# Patient Record
Sex: Female | Born: 1935 | Hispanic: No | Smoking: Former smoker
Health system: Southern US, Community
[De-identification: ages and names within clinical notes are randomized; demographics above are authoritative.]

## PROBLEM LIST (undated history)

## (undated) DIAGNOSIS — E785 Hyperlipidemia, unspecified: Secondary | ICD-10-CM

## (undated) DIAGNOSIS — M1990 Unspecified osteoarthritis, unspecified site: Secondary | ICD-10-CM

## (undated) DIAGNOSIS — F0280 Dementia in other diseases classified elsewhere without behavioral disturbance: Secondary | ICD-10-CM

## (undated) DIAGNOSIS — G309 Alzheimer's disease, unspecified: Secondary | ICD-10-CM

## (undated) DIAGNOSIS — K449 Diaphragmatic hernia without obstruction or gangrene: Secondary | ICD-10-CM

## (undated) DIAGNOSIS — H3530 Unspecified macular degeneration: Secondary | ICD-10-CM

## (undated) HISTORY — PX: ABDOMINAL HYSTERECTOMY: SHX81

---

## 2010-05-16 ENCOUNTER — Ambulatory Visit: Payer: Self-pay | Admitting: Family Medicine

## 2010-05-16 DIAGNOSIS — F39 Unspecified mood [affective] disorder: Secondary | ICD-10-CM | POA: Insufficient documentation

## 2010-05-16 DIAGNOSIS — E785 Hyperlipidemia, unspecified: Secondary | ICD-10-CM

## 2010-05-16 DIAGNOSIS — F0280 Dementia in other diseases classified elsewhere without behavioral disturbance: Secondary | ICD-10-CM

## 2010-05-16 DIAGNOSIS — G309 Alzheimer's disease, unspecified: Secondary | ICD-10-CM

## 2010-05-23 ENCOUNTER — Encounter: Payer: Self-pay | Admitting: Family Medicine

## 2010-05-24 LAB — CONVERTED CEMR LAB
ALT: 13 units/L (ref 0–35)
AST: 24 units/L (ref 0–37)
Albumin: 4.1 g/dL (ref 3.5–5.2)
Alkaline Phosphatase: 63 units/L (ref 39–117)
LDL Cholesterol: 74 mg/dL (ref 0–99)
Potassium: 4.7 meq/L (ref 3.5–5.3)
Sodium: 139 meq/L (ref 135–145)
TSH: 2.821 microintl units/mL (ref 0.350–4.500)
Total Bilirubin: 0.5 mg/dL (ref 0.3–1.2)
Total Protein: 6.7 g/dL (ref 6.0–8.3)
VLDL: 25 mg/dL (ref 0–40)

## 2010-08-06 NOTE — Assessment & Plan Note (Signed)
Summary: NOV: Alzheimers   Vital Signs:  Patient profile:   77 year old female Height:      67 inches Weight:      201 pounds BMI:     31.59 Pulse rate:   85 / minute BP sitting:   122 / 73  (right arm) Cuff size:   regular  Vitals Entered By: Avon Gully CMA, Duncan Dull) (May 16, 2010 1:38 PM) CC: Np-est care   CC:  Np-est care.  History of Present Illness: Previsouly seein Dr. Saunders Revel in [place].  Moved her about 2 months ago. Sleeps well. No s.e. of her medication.  Goest to the community center twice a week. Hx of Alzhimers.    Her son is worried she is not taking her meds correctly because she is sometimes forgetting them and sometimes has taken too many at one time. She is in a retirement community. He is looking at getting a monitored med system for her.    Habits & Providers  Alcohol-Tobacco-Diet     Alcohol drinks/day: <1     Tobacco Status: quit     Year Quit: 10 yr ago  Exercise-Depression-Behavior     Drug Use: never  Current Medications (verified): 1)  Tricor 145 Mg Tabs (Fenofibrate) .... Take One By Mouth Once Daily 2)  Lipitor 20 Mg Tabs (Atorvastatin Calcium) .... Take One Tablet By Mouth At Bedtime 3)  Donepezil Hcl 10 Mg Tabs (Donepezil Hcl) .... Take One Tablet By Mouth Once Daily 4)  Trazodone Hcl 150 Mg Tabs (Trazodone Hcl) .... Take One Tablet By Mouth At Bedtime  Allergies (verified): No Known Drug Allergies  Comments:  Nurse/Medical Assistant: The patient's medications and allergies were reviewed with the patient and were updated in the Medication and Allergy Lists. Avon Gully CMA, Duncan Dull) (May 16, 2010 1:43 PM)  Past History:  Past Surgical History: none   Family History: son with hypercholesterolemia and hypertension. 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  Completed high school.  Widowed.  Has one son.  Currently living in a retirement community.  Quit smoking 10 years ago.  No  alcohol.  No drug use.  Two caffeinated drinks daily.  No regular exercise.Smoking Status:  quit Drug Use:  never  Review of Systems       No fever/sweats/weakness, unexplained weight loss/gain.  No vison changes.  No difficulty hearing/ringing in ears, hay fever/allergies.  No chest pain/discomfort, palpitations.  No Br lump/nipple discharge.  No cough/wheeze.  No blood in BM, nausea/vomiting/diarrhea.  No nighttime urination, leaking urine, unusual vaginal bleeding, discharge (penis or vagina).  No muscle/joint pain. No rash, change in mole.  No HA, memory loss.  No anxiety, sleep d/o, depression.  No easy bruising/bleeding, unexplained lump   Physical Exam  General:  Well-developed,well-nourished,in no acute distress; alert,appropriate and cooperative throughout examination. Overweight female who appears younger than stated age.  Lungs:  Normal respiratory effort, chest expands symmetrically. Lungs are clear to auscultation, no crackles or wheezes. Heart:  Normal rate and regular rhythm. S1 and S2 normal without gallop, murmur, click, rub or other extra sounds. No carotid bruits.  Skin:  no rashes.   Cervical Nodes:  No lymphadenopathy noted Psych:  Cognition and judgment appear intact. Alert and cooperative with normal attention span and concentration. No apparent delusions, illusions, hallucinations   Impression & Recommendations:  Problem # 1:  ALZHEIMER'S DISEASE (ICD-331.0) I really need to get her records to have a better idea of the  severity of her disease I recommended reading the 36 hour day to her step-son who is here with her today. Will check TSH as well.  F/U in one month and can discussed if might benefit from adding namenda to her regimen.  Orders: T-TSH ([id number])  Problem # 2:  HYPERLIPIDEMIA (ICD-272.4) Due for labs to see if she is at goal. she is tolerating her medications well.  There there is definitely a question about the consistency of her taking the  medications and she may even been taking more than one pill at time of the same medication. Her updated medication list for this problem includes:    Tricor 145 Mg Tabs (Fenofibrate) .Marland Kitchen... Take one by mouth once daily    Lipitor 20 Mg Tabs (Atorvastatin calcium) .Marland Kitchen... Take one tablet by mouth at bedtime  Orders: T-Comprehensive Metabolic Panel [id number]) T-Lipid Profile ([id number])  Complete Medication List: 1)  Tricor 145 Mg Tabs (Fenofibrate) .... Take one by mouth once daily 2)  Lipitor 20 Mg Tabs (Atorvastatin calcium) .... Take one tablet by mouth at bedtime 3)  Donepezil Hcl 10 Mg Tabs (Donepezil hcl) .... Take one tablet by mouth once daily 4)  Trazodone Hcl 150 Mg Tabs (Trazodone hcl) .... Take one tablet by mouth at bedtime  Other Orders: Flu Vaccine 90yrs + MEDICARE PATIENTS (M8413) Administration Flu vaccine - MCR (K4401)  Patient Instructions: 1)  Follow up in 1-2 months once we have your records so we can follow up on your memory.  2)  You were given a flu vaccine today. Prescriptions: TRAZODONE HCL 150 MG TABS (TRAZODONE HCL) take one tablet by mouth at bedtime  #30 x 4   Entered and Authorized by:   Nani Gasser MD   Signed by:   Nani Gasser MD on 05/16/2010   Method used:   Electronically to        [facility] [phone]* (retail)       [address]       [place], [place]  [address]       Ph: [phone]       Fax: [phone]   RxID:   [id number]    Orders Added: 1)  T-Comprehensive Metabolic Panel [[id number]] 2)  T-Lipid Profile [[id number]] 3)  T-TSH [[id number]] 4)  New Patient Level III [37628] 5)  Flu Vaccine 56yrs + MEDICARE PATIENTS [Q2039] 6)  Administration Flu vaccine - MCR [G0008] Flu Vaccine Consent Questions     Do you have a history of severe allergic reactions to this vaccine? no    Any prior history of allergic reactions to egg and/or gelatin? no    Do you have a sensitivity to the preservative Thimersol? no    Do  you have a past history of Guillan-Barre Syndrome? no    Do you currently have an acute febrile illness? no    Have you ever had a severe reaction to latex? no    Vaccine information given and explained to patient? yes    Are you currently pregnant? no    Lot Number:[id number]   Exp Date:01/04/2011   Site Given  Left Deltoid IM 3)  T-TSH [[id number]] 4)  New Patient Level III [99203] 5)  Flu Vaccine 50yrs + MEDICARE PATIENTS [Q2039] 6)  Administration Flu vaccine - MCR [G0008]   .lbmedflu

## 2010-10-14 ENCOUNTER — Other Ambulatory Visit: Payer: Self-pay | Admitting: Family Medicine

## 2010-10-15 ENCOUNTER — Other Ambulatory Visit: Payer: Self-pay | Admitting: *Deleted

## 2010-10-15 MED ORDER — FENOFIBRATE 145 MG PO TABS: 145.0000 mg | ORAL_TABLET | Freq: Every day | ORAL | Status: DC

## 2010-10-15 MED ORDER — DONEPEZIL HCL 10 MG PO TABS: 10.0000 mg | ORAL_TABLET | Freq: Every day | ORAL | Status: DC

## 2010-10-15 MED ORDER — TRAZODONE HCL 150 MG PO TABS: 150.0000 mg | ORAL_TABLET | Freq: Every day | ORAL | Status: DC

## 2010-10-15 MED ORDER — ATORVASTATIN CALCIUM 20 MG PO TABS: 20.0000 mg | ORAL_TABLET | Freq: Every day | ORAL | Status: DC

## 2010-12-06 ENCOUNTER — Other Ambulatory Visit: Payer: Self-pay | Admitting: Family Medicine

## 2011-01-06 ENCOUNTER — Other Ambulatory Visit: Payer: Self-pay | Admitting: Family Medicine

## 2011-01-11 ENCOUNTER — Other Ambulatory Visit: Payer: Self-pay | Admitting: Family Medicine

## 2011-01-12 NOTE — Telephone Encounter (Signed)
PT NEEDS OFFICE VISIT WITHIN 30 DAYS FOR ALZHEIMER AND FASTING LABS(CHOLESTEROL).  LAST SEEN 11-11 AND PT WAS SUPPOSE TO FOLLOW UP IN 1 MTH AND DIDN'T.

## 2011-02-13 ENCOUNTER — Telehealth: Payer: Self-pay | Admitting: Family Medicine

## 2011-02-13 ENCOUNTER — Other Ambulatory Visit: Payer: Self-pay | Admitting: *Deleted

## 2011-02-13 MED ORDER — DONEPEZIL HCL 10 MG PO TABS: 10.0000 mg | ORAL_TABLET | Freq: Every day | ORAL | Status: DC

## 2011-02-13 MED ORDER — ATORVASTATIN CALCIUM 20 MG PO TABS: 20.0000 mg | ORAL_TABLET | Freq: Every day | ORAL | Status: DC

## 2011-02-13 MED ORDER — TRAZODONE HCL 150 MG PO TABS: 150.0000 mg | ORAL_TABLET | Freq: Every day | ORAL | Status: DC

## 2011-02-13 MED ORDER — FENOFIBRATE 145 MG PO TABS: 145.0000 mg | ORAL_TABLET | Freq: Every day | ORAL | Status: DC

## 2011-02-13 NOTE — Telephone Encounter (Signed)
Prarmacy  ([facility]) calling because pt is switching all her medications to this pharmacy and they are going to need new scripts.  Plan:  Reviewed the chart and the scripts were sent to [facility].  Called pharm [facility] and told them to retrieve from the [facility] in [place].  Pharm able to do. Jarvis Newcomer, LPN Domingo Dimes

## 2011-03-04 ENCOUNTER — Other Ambulatory Visit: Payer: Self-pay | Admitting: *Deleted

## 2011-03-04 MED ORDER — ATORVASTATIN CALCIUM 20 MG PO TABS: 20.0000 mg | ORAL_TABLET | Freq: Every day | ORAL | Status: DC

## 2011-03-04 MED ORDER — FENOFIBRATE 145 MG PO TABS: 145.0000 mg | ORAL_TABLET | Freq: Every day | ORAL | Status: DC

## 2011-03-04 MED ORDER — DONEPEZIL HCL 10 MG PO TABS: 10.0000 mg | ORAL_TABLET | Freq: Every day | ORAL | Status: DC

## 2011-03-06 ENCOUNTER — Other Ambulatory Visit: Payer: Self-pay | Admitting: *Deleted

## 2011-03-11 ENCOUNTER — Other Ambulatory Visit: Payer: Self-pay | Admitting: *Deleted

## 2011-03-11 MED ORDER — DONEPEZIL HCL 10 MG PO TABS: 10.0000 mg | ORAL_TABLET | Freq: Every day | ORAL | Status: DC

## 2011-03-26 ENCOUNTER — Other Ambulatory Visit: Payer: Self-pay | Admitting: *Deleted

## 2011-03-26 MED ORDER — TRAZODONE HCL 150 MG PO TABS: 150.0000 mg | ORAL_TABLET | Freq: Every day | ORAL | Status: DC

## 2011-04-06 ENCOUNTER — Other Ambulatory Visit: Payer: Self-pay | Admitting: Family Medicine

## 2011-04-23 ENCOUNTER — Telehealth: Payer: Self-pay | Admitting: *Deleted

## 2011-04-23 NOTE — Telephone Encounter (Signed)
Patient needs a letter stating the medications she is on are medically necessary so the insurance will pay for per their new policy

## 2011-04-24 ENCOUNTER — Telehealth: Payer: Self-pay | Admitting: Family Medicine

## 2011-04-24 NOTE — Telephone Encounter (Signed)
Pt's son called and said he was suppose to have picked up a letter for his mother, and he had been called to let him know it was ready, but he had not been able to get here before our office closed.  Plan:  Pt's son  informed will retrieve the letter from up front and fax it to the number he requested. Jarvis Newcomer, LPN Domingo Dimes

## 2011-05-08 ENCOUNTER — Encounter: Payer: Self-pay | Admitting: Family Medicine

## 2011-05-14 ENCOUNTER — Encounter: Payer: Self-pay | Admitting: Family Medicine

## 2011-05-14 ENCOUNTER — Ambulatory Visit (INDEPENDENT_AMBULATORY_CARE_PROVIDER_SITE_OTHER): Payer: Medicare Other | Admitting: Family Medicine

## 2011-05-14 VITALS — BP 104/69 | HR 91 | Wt 192.0 lb

## 2011-05-14 DIAGNOSIS — G309 Alzheimer's disease, unspecified: Secondary | ICD-10-CM

## 2011-05-14 DIAGNOSIS — Z23 Encounter for immunization: Secondary | ICD-10-CM

## 2011-05-14 DIAGNOSIS — E785 Hyperlipidemia, unspecified: Secondary | ICD-10-CM

## 2011-05-14 DIAGNOSIS — F0280 Dementia in other diseases classified elsewhere without behavioral disturbance: Secondary | ICD-10-CM

## 2011-05-14 NOTE — Patient Instructions (Addendum)
We will call you with your lab results. If you don't here from us in about a week then please give us a call at [phone].  

## 2011-05-14 NOTE — Progress Notes (Signed)
  Subjective:    Patient ID: Sandra Tucker, female    DOB: 10/07/1922, 77 y.o.   MRN: [id number]  Hyperlipidemia This is a chronic problem. The current episode started more than 1 month ago. The problem is controlled. Recent lipid tests were reviewed and are normal. She has no history of obesity. Pertinent negatives include no chest pain, myalgias or shortness of breath. Current antihyperlipidemic treatment includes statins. The current treatment provides moderate improvement of lipids. There are no compliance problems.  Risk factors for coronary artery disease include obesity.   Getting help with hre meds daily. She lives in an assisted living center but her son helps pay for someone to come in and assist her with her medications including her eyedrops.  Sees Dr. Jackquline Bosch is seeing her for her eyes.  On travastan drops. She has noticed improvement in her vision since starting the drops.  Dementia - dong well on the Aricpet. No SE. her son feels that her dementia is stable. She is still able to get out and participate in activities at the assisted living center. She feels motivated to do that and denies any feelings of depression or social withdrawal.   Review of Systems  Respiratory: Negative for shortness of breath.   Cardiovascular: Negative for chest pain.  Musculoskeletal: Negative for myalgias.       Objective:   Physical Exam  Constitutional: She is oriented to person, place, and time. She appears well-developed and well-nourished.  HENT:  Head: Normocephalic and atraumatic.  Neck: Neck supple. No thyromegaly present.  Cardiovascular: Normal rate, regular rhythm and normal heart sounds.   Pulmonary/Chest: Effort normal and breath sounds normal.  Musculoskeletal: She exhibits no edema.  Lymphadenopathy:    She has no cervical adenopathy.  Neurological: She is alert and oriented to person, place, and time.  Skin: Skin is warm and dry.  Psychiatric: She has a normal mood and  affect. Her behavior is normal.          Assessment & Plan:  Dementia - MMSE 05/2011  14/30.  (passing 26). Will refill her aricpet. Followup in 1 year. We can recheck her in a mental status exam at that time. If she has a positive Aricept please call the office. I encouraged her to continue to be as active as possible participate in activities to help stimulate her brain. She is getting assistance with her medications.  Hyperlipidemia- Due for labs to recheck. BP looks great. Given lab slip today. We will call her with the results.  She was given the flu shot for the shingles vaccine today. Her son is not sure when her last tetanus shot was. We will try to see if we can find this in her old records.

## 2011-05-14 NOTE — Progress Notes (Signed)
Addended by: Wyline Beady on: 05/14/2011 01:23 PM   Modules accepted: Orders

## 2011-05-16 ENCOUNTER — Other Ambulatory Visit: Payer: Self-pay | Admitting: Family Medicine

## 2011-05-21 LAB — COMPLETE METABOLIC PANEL WITH GFR
AST: 32 U/L (ref 0–37)
Alkaline Phosphatase: 60 U/L (ref 39–117)
GFR, Est Non African American: 71 mL/min — ABNORMAL LOW (ref >89)
Glucose, Bld: 88 mg/dL (ref 70–99)
Potassium: 4.8 mEq/L (ref 3.5–5.3)
Sodium: 141 mEq/L (ref 135–145)
Total Bilirubin: 0.6 mg/dL (ref 0.3–1.2)
Total Protein: 6.5 g/dL (ref 6.0–8.3)

## 2011-05-21 LAB — LIPID PANEL
HDL: 32 mg/dL — ABNORMAL LOW (ref >39)
LDL Cholesterol: 93 mg/dL (ref 0–99)
Total CHOL/HDL Ratio: 5.1 Ratio
VLDL: 39 mg/dL (ref 0–40)

## 2011-05-21 LAB — CBC
HCT: 39.7 % (ref 36.0–46.0)
Hemoglobin: 12.2 g/dL (ref 12.0–15.0)
MCHC: 30.7 g/dL (ref 30.0–36.0)
MCV: 76.9 fL — ABNORMAL LOW (ref 78.0–100.0)
WBC: 4 10*3/uL (ref 4.0–10.5)

## 2011-07-23 DIAGNOSIS — H353290 Exudative age-related macular degeneration, unspecified eye, stage unspecified: Secondary | ICD-10-CM | POA: Diagnosis not present

## 2011-07-23 DIAGNOSIS — H43819 Vitreous degeneration, unspecified eye: Secondary | ICD-10-CM | POA: Diagnosis not present

## 2011-07-23 DIAGNOSIS — H35059 Retinal neovascularization, unspecified, unspecified eye: Secondary | ICD-10-CM | POA: Diagnosis not present

## 2011-07-23 DIAGNOSIS — H31019 Macula scars of posterior pole (postinflammatory) (post-traumatic), unspecified eye: Secondary | ICD-10-CM | POA: Diagnosis not present

## 2011-08-17 ENCOUNTER — Other Ambulatory Visit: Payer: Self-pay | Admitting: Family Medicine

## 2011-08-18 ENCOUNTER — Telehealth: Payer: Self-pay | Admitting: *Deleted

## 2011-08-18 NOTE — Telephone Encounter (Signed)
Pt's son would like to speak with you in regards to medications. He states over the next couple of days will be fine if you would please call him. He states anytime will be fine for you to call.

## 2011-08-18 NOTE — Telephone Encounter (Signed)
Can you get some more specifics about the meds he has concerns or questions about first.

## 2011-08-19 MED ORDER — PRAVASTATIN SODIUM 40 MG PO TABS: 40.0000 mg | ORAL_TABLET | Freq: Every day | ORAL | Status: DC

## 2011-08-19 NOTE — Telephone Encounter (Signed)
Son states that funds are getting tight and is happy with where she is living. States that she has lost sight in her rt eye and is losing sight in the Lt eye. States that she has to have shots in her eye monthly. He would like to know is it necessary to have her on 2 chol meds? How much affect does it have on her health? States that if you call him today and he doesn't answer to leave him a message b/c he is looking into getting some work and he will call you back.

## 2011-08-19 NOTE — Telephone Encounter (Signed)
Already updated med list.

## 2011-08-19 NOTE — Telephone Encounter (Signed)
Son states he understands the complications that can result from changing her meds or if she doesn't take them at all. Son would like to try the Pravastatin. States he has already picked up the 2 chol meds for this month but wants to start her on the new one next month. What directions on the pravastatin so we can add it to med list and remove the other 2 chol meds?

## 2011-08-19 NOTE — Telephone Encounter (Signed)
OK, We can hold the tribenzor and we can change to pravastatin.  It is a little chearper than the atorvastatin though both come generic.

## 2011-08-20 DIAGNOSIS — H31019 Macula scars of posterior pole (postinflammatory) (post-traumatic), unspecified eye: Secondary | ICD-10-CM | POA: Diagnosis not present

## 2011-08-20 DIAGNOSIS — H353290 Exudative age-related macular degeneration, unspecified eye, stage unspecified: Secondary | ICD-10-CM | POA: Diagnosis not present

## 2011-08-20 DIAGNOSIS — H43819 Vitreous degeneration, unspecified eye: Secondary | ICD-10-CM | POA: Diagnosis not present

## 2011-08-20 DIAGNOSIS — H35059 Retinal neovascularization, unspecified, unspecified eye: Secondary | ICD-10-CM | POA: Diagnosis not present

## 2011-09-17 DIAGNOSIS — H31019 Macula scars of posterior pole (postinflammatory) (post-traumatic), unspecified eye: Secondary | ICD-10-CM | POA: Diagnosis not present

## 2011-09-17 DIAGNOSIS — H35059 Retinal neovascularization, unspecified, unspecified eye: Secondary | ICD-10-CM | POA: Diagnosis not present

## 2011-09-17 DIAGNOSIS — H353290 Exudative age-related macular degeneration, unspecified eye, stage unspecified: Secondary | ICD-10-CM | POA: Diagnosis not present

## 2011-09-17 DIAGNOSIS — H43819 Vitreous degeneration, unspecified eye: Secondary | ICD-10-CM | POA: Diagnosis not present

## 2011-09-24 ENCOUNTER — Other Ambulatory Visit: Payer: Self-pay | Admitting: Family Medicine

## 2011-09-24 MED ORDER — PRAVASTATIN SODIUM 40 MG PO TABS: 40.0000 mg | ORAL_TABLET | Freq: Every day | ORAL | Status: DC

## 2011-09-24 MED ORDER — DONEPEZIL HCL 10 MG PO TABS: 10.0000 mg | ORAL_TABLET | Freq: Every day | ORAL | Status: DC

## 2011-09-26 ENCOUNTER — Other Ambulatory Visit: Payer: Self-pay | Admitting: *Deleted

## 2011-09-26 ENCOUNTER — Other Ambulatory Visit: Payer: Self-pay | Admitting: Family Medicine

## 2011-09-26 MED ORDER — FENOFIBRATE 145 MG PO TABS: 145.0000 mg | ORAL_TABLET | Freq: Every day | ORAL | Status: DC

## 2011-10-20 DIAGNOSIS — H31019 Macula scars of posterior pole (postinflammatory) (post-traumatic), unspecified eye: Secondary | ICD-10-CM | POA: Diagnosis not present

## 2011-10-20 DIAGNOSIS — H35059 Retinal neovascularization, unspecified, unspecified eye: Secondary | ICD-10-CM | POA: Diagnosis not present

## 2011-10-20 DIAGNOSIS — H43819 Vitreous degeneration, unspecified eye: Secondary | ICD-10-CM | POA: Diagnosis not present

## 2011-10-20 DIAGNOSIS — H353290 Exudative age-related macular degeneration, unspecified eye, stage unspecified: Secondary | ICD-10-CM | POA: Diagnosis not present

## 2011-11-17 DIAGNOSIS — H353290 Exudative age-related macular degeneration, unspecified eye, stage unspecified: Secondary | ICD-10-CM | POA: Diagnosis not present

## 2011-11-17 DIAGNOSIS — H43819 Vitreous degeneration, unspecified eye: Secondary | ICD-10-CM | POA: Diagnosis not present

## 2011-11-17 DIAGNOSIS — H31019 Macula scars of posterior pole (postinflammatory) (post-traumatic), unspecified eye: Secondary | ICD-10-CM | POA: Diagnosis not present

## 2011-11-17 DIAGNOSIS — H35059 Retinal neovascularization, unspecified, unspecified eye: Secondary | ICD-10-CM | POA: Diagnosis not present

## 2011-11-25 ENCOUNTER — Other Ambulatory Visit: Payer: Self-pay | Admitting: Family Medicine

## 2011-12-16 DIAGNOSIS — H35059 Retinal neovascularization, unspecified, unspecified eye: Secondary | ICD-10-CM | POA: Diagnosis not present

## 2011-12-16 DIAGNOSIS — H31019 Macula scars of posterior pole (postinflammatory) (post-traumatic), unspecified eye: Secondary | ICD-10-CM | POA: Diagnosis not present

## 2011-12-16 DIAGNOSIS — H43819 Vitreous degeneration, unspecified eye: Secondary | ICD-10-CM | POA: Diagnosis not present

## 2011-12-16 DIAGNOSIS — H353290 Exudative age-related macular degeneration, unspecified eye, stage unspecified: Secondary | ICD-10-CM | POA: Diagnosis not present

## 2011-12-29 ENCOUNTER — Other Ambulatory Visit: Payer: Self-pay | Admitting: *Deleted

## 2011-12-29 ENCOUNTER — Other Ambulatory Visit: Payer: Self-pay | Admitting: Family Medicine

## 2011-12-29 MED ORDER — FENOFIBRATE 145 MG PO TABS: 145.0000 mg | ORAL_TABLET | Freq: Every day | ORAL | Status: DC

## 2012-02-05 DIAGNOSIS — H35059 Retinal neovascularization, unspecified, unspecified eye: Secondary | ICD-10-CM | POA: Diagnosis not present

## 2012-02-05 DIAGNOSIS — H31019 Macula scars of posterior pole (postinflammatory) (post-traumatic), unspecified eye: Secondary | ICD-10-CM | POA: Diagnosis not present

## 2012-02-05 DIAGNOSIS — H353290 Exudative age-related macular degeneration, unspecified eye, stage unspecified: Secondary | ICD-10-CM | POA: Diagnosis not present

## 2012-02-05 DIAGNOSIS — H43819 Vitreous degeneration, unspecified eye: Secondary | ICD-10-CM | POA: Diagnosis not present

## 2012-02-06 ENCOUNTER — Other Ambulatory Visit: Payer: Self-pay | Admitting: Family Medicine

## 2012-03-08 ENCOUNTER — Other Ambulatory Visit: Payer: Self-pay | Admitting: Family Medicine

## 2012-03-23 DIAGNOSIS — H43819 Vitreous degeneration, unspecified eye: Secondary | ICD-10-CM | POA: Diagnosis not present

## 2012-03-23 DIAGNOSIS — H35059 Retinal neovascularization, unspecified, unspecified eye: Secondary | ICD-10-CM | POA: Diagnosis not present

## 2012-03-23 DIAGNOSIS — H353290 Exudative age-related macular degeneration, unspecified eye, stage unspecified: Secondary | ICD-10-CM | POA: Diagnosis not present

## 2012-03-23 DIAGNOSIS — H31019 Macula scars of posterior pole (postinflammatory) (post-traumatic), unspecified eye: Secondary | ICD-10-CM | POA: Diagnosis not present

## 2012-04-20 DIAGNOSIS — H353290 Exudative age-related macular degeneration, unspecified eye, stage unspecified: Secondary | ICD-10-CM | POA: Diagnosis not present

## 2012-04-20 DIAGNOSIS — H35059 Retinal neovascularization, unspecified, unspecified eye: Secondary | ICD-10-CM | POA: Diagnosis not present

## 2012-05-14 ENCOUNTER — Other Ambulatory Visit: Payer: Self-pay | Admitting: Family Medicine

## 2012-05-18 DIAGNOSIS — H353290 Exudative age-related macular degeneration, unspecified eye, stage unspecified: Secondary | ICD-10-CM | POA: Diagnosis not present

## 2012-05-18 DIAGNOSIS — H43819 Vitreous degeneration, unspecified eye: Secondary | ICD-10-CM | POA: Diagnosis not present

## 2012-05-18 DIAGNOSIS — H31019 Macula scars of posterior pole (postinflammatory) (post-traumatic), unspecified eye: Secondary | ICD-10-CM | POA: Diagnosis not present

## 2012-05-18 DIAGNOSIS — H35059 Retinal neovascularization, unspecified, unspecified eye: Secondary | ICD-10-CM | POA: Diagnosis not present

## 2012-06-02 ENCOUNTER — Other Ambulatory Visit: Payer: Self-pay | Admitting: *Deleted

## 2012-06-02 MED ORDER — FENOFIBRATE 145 MG PO TABS: 145.0000 mg | ORAL_TABLET | Freq: Every day | ORAL | Status: DC

## 2012-06-16 DIAGNOSIS — H43819 Vitreous degeneration, unspecified eye: Secondary | ICD-10-CM | POA: Diagnosis not present

## 2012-06-16 DIAGNOSIS — H35059 Retinal neovascularization, unspecified, unspecified eye: Secondary | ICD-10-CM | POA: Diagnosis not present

## 2012-06-16 DIAGNOSIS — H353290 Exudative age-related macular degeneration, unspecified eye, stage unspecified: Secondary | ICD-10-CM | POA: Diagnosis not present

## 2012-06-16 DIAGNOSIS — H31019 Macula scars of posterior pole (postinflammatory) (post-traumatic), unspecified eye: Secondary | ICD-10-CM | POA: Diagnosis not present

## 2012-07-08 ENCOUNTER — Other Ambulatory Visit: Payer: Self-pay | Admitting: Family Medicine

## 2012-07-12 ENCOUNTER — Encounter: Payer: Self-pay | Admitting: Family Medicine

## 2012-07-12 ENCOUNTER — Ambulatory Visit (INDEPENDENT_AMBULATORY_CARE_PROVIDER_SITE_OTHER): Payer: Medicare Other | Admitting: Family Medicine

## 2012-07-12 VITALS — BP 117/70 | HR 85 | Resp 20 | Wt 186.0 lb

## 2012-07-12 DIAGNOSIS — E785 Hyperlipidemia, unspecified: Secondary | ICD-10-CM

## 2012-07-12 DIAGNOSIS — Z23 Encounter for immunization: Secondary | ICD-10-CM | POA: Diagnosis not present

## 2012-07-12 DIAGNOSIS — H3530 Unspecified macular degeneration: Secondary | ICD-10-CM | POA: Diagnosis not present

## 2012-07-12 DIAGNOSIS — F0390 Unspecified dementia without behavioral disturbance: Secondary | ICD-10-CM | POA: Diagnosis not present

## 2012-07-12 MED ORDER — PRAVASTATIN SODIUM 40 MG PO TABS: 40.0000 mg | ORAL_TABLET | Freq: Every day | ORAL | Status: DC

## 2012-07-12 MED ORDER — FENOFIBRATE 145 MG PO TABS: 145.0000 mg | ORAL_TABLET | Freq: Every day | ORAL | Status: DC

## 2012-07-12 MED ORDER — DONEPEZIL HCL 10 MG PO TABS: 10.0000 mg | ORAL_TABLET | Freq: Every day | ORAL | Status: DC

## 2012-07-12 NOTE — Patient Instructions (Addendum)
Please followup in 6 months for chronic medications. See if he can find a date or copy of her tetanus shot. If not we can get her up today at her followup visit. Please call the lab when she can fast for 8 hours. She can have water before having her blood work drawn.

## 2012-07-12 NOTE — Progress Notes (Signed)
  Subjective:    Patient ID: Sandra Tucker, female    DOB: 1922/11/05, 79 y.o.   MRN: [id number]  HPI Dementia - tolerating meds well. She's not had any nausea or side effects on the Aricept. Her son who is here with her today feels like she is at her baseline. She is currently in an assisted living center. She does get some assistance with her medications. Her son would like them written for 90 day supplies.  Hyperlpidemia -  Pt denies chest pain, SOB, dizziness, or heart palpitations.  Taking meds as directed w/o problems.  Denies medication side effects.     Review of Systems     Objective:   Physical Exam  Constitutional: She is oriented to person, place, and time. She appears well-developed and well-nourished.  HENT:  Head: Normocephalic and atraumatic.  Cardiovascular: Normal rate, regular rhythm and normal heart sounds.   Pulmonary/Chest: Effort normal and breath sounds normal.  Neurological: She is alert and oriented to person, place, and time.  Skin: Skin is warm and dry.  Psychiatric: She has a normal mood and affect. Her behavior is normal.          Assessment & Plan:  Dementia - she's stable on her current regimen and tolerating it well without any nausea. Refill the Aricept for 90 supply. Followup in 6 months.  Hyperlipidemia-well controlled. She's taking her medications regularly. Given a lab slip today to check liver function as well as repeat lipids. We will call her with results when they're available. Refill sent to pharmacy for 90 day supplies. Followup in 6 months.  Her sinus not sure when she may have had a tetanus shot he will call her old doctor and see if they have given it to her at the assisted living center. Her flu vaccine was given today.

## 2012-07-19 DIAGNOSIS — H353290 Exudative age-related macular degeneration, unspecified eye, stage unspecified: Secondary | ICD-10-CM | POA: Diagnosis not present

## 2012-07-19 DIAGNOSIS — H35059 Retinal neovascularization, unspecified, unspecified eye: Secondary | ICD-10-CM | POA: Diagnosis not present

## 2012-07-19 DIAGNOSIS — E785 Hyperlipidemia, unspecified: Secondary | ICD-10-CM | POA: Diagnosis not present

## 2012-07-19 LAB — COMPLETE METABOLIC PANEL WITH GFR
Alkaline Phosphatase: 69 U/L (ref 39–117)
CO2: 25 mEq/L (ref 19–32)
Creat: 0.83 mg/dL (ref 0.50–1.10)
GFR, Est African American: 73 mL/min
GFR, Est Non African American: 64 mL/min
Glucose, Bld: 130 mg/dL — ABNORMAL HIGH (ref 70–99)
Sodium: 140 mEq/L (ref 135–145)
Total Bilirubin: 0.6 mg/dL (ref 0.3–1.2)
Total Protein: 6.4 g/dL (ref 6.0–8.3)

## 2012-07-20 NOTE — Progress Notes (Signed)
Quick Note:  All labs are normal. ______ 

## 2012-08-17 DIAGNOSIS — H35059 Retinal neovascularization, unspecified, unspecified eye: Secondary | ICD-10-CM | POA: Diagnosis not present

## 2012-08-17 DIAGNOSIS — H31019 Macula scars of posterior pole (postinflammatory) (post-traumatic), unspecified eye: Secondary | ICD-10-CM | POA: Diagnosis not present

## 2012-08-17 DIAGNOSIS — H353290 Exudative age-related macular degeneration, unspecified eye, stage unspecified: Secondary | ICD-10-CM | POA: Diagnosis not present

## 2012-09-14 DIAGNOSIS — H35059 Retinal neovascularization, unspecified, unspecified eye: Secondary | ICD-10-CM | POA: Diagnosis not present

## 2012-09-14 DIAGNOSIS — H353290 Exudative age-related macular degeneration, unspecified eye, stage unspecified: Secondary | ICD-10-CM | POA: Diagnosis not present

## 2012-10-12 DIAGNOSIS — H31019 Macula scars of posterior pole (postinflammatory) (post-traumatic), unspecified eye: Secondary | ICD-10-CM | POA: Diagnosis not present

## 2012-10-12 DIAGNOSIS — H353190 Nonexudative age-related macular degeneration, unspecified eye, stage unspecified: Secondary | ICD-10-CM | POA: Diagnosis not present

## 2012-10-12 DIAGNOSIS — H353290 Exudative age-related macular degeneration, unspecified eye, stage unspecified: Secondary | ICD-10-CM | POA: Diagnosis not present

## 2012-10-12 DIAGNOSIS — H35059 Retinal neovascularization, unspecified, unspecified eye: Secondary | ICD-10-CM | POA: Diagnosis not present

## 2012-11-09 DIAGNOSIS — H35059 Retinal neovascularization, unspecified, unspecified eye: Secondary | ICD-10-CM | POA: Diagnosis not present

## 2012-11-09 DIAGNOSIS — H31019 Macula scars of posterior pole (postinflammatory) (post-traumatic), unspecified eye: Secondary | ICD-10-CM | POA: Diagnosis not present

## 2012-11-09 DIAGNOSIS — H43819 Vitreous degeneration, unspecified eye: Secondary | ICD-10-CM | POA: Diagnosis not present

## 2012-11-09 DIAGNOSIS — H353290 Exudative age-related macular degeneration, unspecified eye, stage unspecified: Secondary | ICD-10-CM | POA: Diagnosis not present

## 2012-12-07 DIAGNOSIS — H353290 Exudative age-related macular degeneration, unspecified eye, stage unspecified: Secondary | ICD-10-CM | POA: Diagnosis not present

## 2012-12-07 DIAGNOSIS — H31019 Macula scars of posterior pole (postinflammatory) (post-traumatic), unspecified eye: Secondary | ICD-10-CM | POA: Diagnosis not present

## 2012-12-07 DIAGNOSIS — H35059 Retinal neovascularization, unspecified, unspecified eye: Secondary | ICD-10-CM | POA: Diagnosis not present

## 2012-12-07 DIAGNOSIS — H43819 Vitreous degeneration, unspecified eye: Secondary | ICD-10-CM | POA: Diagnosis not present

## 2012-12-18 ENCOUNTER — Other Ambulatory Visit: Payer: Self-pay | Admitting: Family Medicine

## 2013-01-04 DIAGNOSIS — H35059 Retinal neovascularization, unspecified, unspecified eye: Secondary | ICD-10-CM | POA: Diagnosis not present

## 2013-01-04 DIAGNOSIS — H353290 Exudative age-related macular degeneration, unspecified eye, stage unspecified: Secondary | ICD-10-CM | POA: Diagnosis not present

## 2013-02-02 DIAGNOSIS — H35059 Retinal neovascularization, unspecified, unspecified eye: Secondary | ICD-10-CM | POA: Diagnosis not present

## 2013-02-02 DIAGNOSIS — H353290 Exudative age-related macular degeneration, unspecified eye, stage unspecified: Secondary | ICD-10-CM | POA: Diagnosis not present

## 2013-02-15 ENCOUNTER — Other Ambulatory Visit: Payer: Self-pay | Admitting: *Deleted

## 2013-02-15 ENCOUNTER — Telehealth: Payer: Self-pay | Admitting: *Deleted

## 2013-02-15 MED ORDER — FENOFIBRATE 145 MG PO TABS: 145.0000 mg | ORAL_TABLET | Freq: Every day | ORAL | Status: DC

## 2013-02-15 NOTE — Telephone Encounter (Signed)
Ok to fill but doesn' need appt in the next month or so

## 2013-02-15 NOTE — Telephone Encounter (Signed)
Med refilled for #90 no refills.

## 2013-02-15 NOTE — Telephone Encounter (Signed)
Son left a message asking for a refill on patient's fenofibrate.  Patient was last here in Jan.

## 2013-03-02 DIAGNOSIS — H31019 Macula scars of posterior pole (postinflammatory) (post-traumatic), unspecified eye: Secondary | ICD-10-CM | POA: Diagnosis not present

## 2013-03-02 DIAGNOSIS — H43819 Vitreous degeneration, unspecified eye: Secondary | ICD-10-CM | POA: Diagnosis not present

## 2013-03-02 DIAGNOSIS — H353290 Exudative age-related macular degeneration, unspecified eye, stage unspecified: Secondary | ICD-10-CM | POA: Diagnosis not present

## 2013-03-02 DIAGNOSIS — H35059 Retinal neovascularization, unspecified, unspecified eye: Secondary | ICD-10-CM | POA: Diagnosis not present

## 2013-03-09 DIAGNOSIS — Z23 Encounter for immunization: Secondary | ICD-10-CM | POA: Diagnosis not present

## 2013-04-01 DIAGNOSIS — H35059 Retinal neovascularization, unspecified, unspecified eye: Secondary | ICD-10-CM | POA: Diagnosis not present

## 2013-04-01 DIAGNOSIS — H353290 Exudative age-related macular degeneration, unspecified eye, stage unspecified: Secondary | ICD-10-CM | POA: Diagnosis not present

## 2013-04-19 ENCOUNTER — Other Ambulatory Visit: Payer: Self-pay | Admitting: Family Medicine

## 2013-04-28 ENCOUNTER — Telehealth: Payer: Self-pay | Admitting: *Deleted

## 2013-04-28 NOTE — Telephone Encounter (Signed)
Pt's son called to find out how long his mother needed to fast prior to coming in for blood work and since it would be so late in the day could she eat a light breakfast. I told him  That she should be fasting however, if he could not get her in on another day to get the labs done then allow her to eat something and have her to drink plenty of water throughout the day until she gets her labs drawn. He voiced understanding and agreed.Loralee Pacas Hawkins

## 2013-04-29 ENCOUNTER — Ambulatory Visit (INDEPENDENT_AMBULATORY_CARE_PROVIDER_SITE_OTHER): Payer: Medicare Other | Admitting: Family Medicine

## 2013-04-29 ENCOUNTER — Encounter: Payer: Self-pay | Admitting: Family Medicine

## 2013-04-29 VITALS — BP 117/70 | HR 80 | Wt 183.0 lb

## 2013-04-29 DIAGNOSIS — N951 Menopausal and female climacteric states: Secondary | ICD-10-CM

## 2013-04-29 DIAGNOSIS — E785 Hyperlipidemia, unspecified: Secondary | ICD-10-CM

## 2013-04-29 DIAGNOSIS — R599 Enlarged lymph nodes, unspecified: Secondary | ICD-10-CM

## 2013-04-29 DIAGNOSIS — Z23 Encounter for immunization: Secondary | ICD-10-CM | POA: Diagnosis not present

## 2013-04-29 DIAGNOSIS — F0280 Dementia in other diseases classified elsewhere without behavioral disturbance: Secondary | ICD-10-CM

## 2013-04-29 DIAGNOSIS — R591 Generalized enlarged lymph nodes: Secondary | ICD-10-CM

## 2013-04-29 NOTE — Progress Notes (Signed)
Subjective:    Patient ID: Sandra Tucker, female    DOB: 09/27/1922, 79 y.o.   MRN: [id number]  HPI Followup dementia-she's doing well on her Aricept. She denies any side effects or problems such as elevated blood pressure or nausea. Her son notices that she has some good days and some bad days. Overall he feels like her dementia is stable. Has not noticed a significant or rapid decline.  Hyperlipidemia-tolerating statin well without any myalgias or side effects of the medication. She is well overdue to have these rechecked. Her son had asked her to fast today but when he went to pick her up she was eating.  Her son is with her here today for the office visit.  Macular degenerative generation-she does get monthly injections followed very closely by her eye doctor. Review of Systems     BP 117/70  Pulse 80  Wt 183 lb (83.008 kg)  BMI 28.66 kg/m2    No Known Allergies  No past medical history on file.  No past surgical history on file.  History   Social History  . Marital Status: Widowed    Spouse Name: N/A    Number of Children: N/A  . Years of Education: N/A   Occupational History  . Not on file.   Social History Main Topics  . Smoking status: Former Smoker    Types: Cigarettes    Quit date: 07/07/2000  . Smokeless tobacco: Not on file  . Alcohol Use: No  . Drug Use: No  . Sexual Activity:      Comment: retired Retail banker, self-employed, widowed, lives in retirement community, 2 caffeine drinks daily, no reg exercise.   Other Topics Concern  . Not on file   Social History Narrative  . No narrative on file    Family History  Problem Relation Age of Onset  . Hyperlipidemia Son   . Hypertension Son   . Heart attack    . Dementia      Outpatient Encounter Prescriptions as of 04/29/2013  Medication Sig Dispense Refill  . donepezil (ARICEPT) 10 MG tablet TAKE 1 TABLET BY MOUTH AT BEDTIME  90 tablet  0  . fenofibrate (TRICOR) 145 MG tablet Take 1 tablet (145 mg  total) by mouth daily.  90 tablet  0  . pravastatin (PRAVACHOL) 40 MG tablet Take 1 tablet (40 mg total) by mouth at bedtime.  90 tablet  1  . travoprost, benzalkonium, (TRAVATAN) 0.004 % ophthalmic solution Place 1 drop into both eyes at bedtime.         No facility-administered encounter medications on file as of 04/29/2013.       Objective:   Physical Exam  Constitutional: She is oriented to person, place, and time. She appears well-developed and well-nourished.  HENT:  Head: Normocephalic and atraumatic.  Neck: Neck supple. No thyromegaly present.  Right ant cervic Ln below the jaw line firm but approx 2 cm in size. Nontender.  No Ln on the left.   Cardiovascular: Normal rate, regular rhythm and normal heart sounds.   Pulmonary/Chest: Effort normal and breath sounds normal.  Musculoskeletal: She exhibits no edema.  Lymphadenopathy:    She has no cervical adenopathy.  Neurological: She is alert and oriented to person, place, and time.  Skin: Skin is warm and dry.  Psychiatric: She has a normal mood and affect. Her behavior is normal.          Assessment & Plan:  Alzheimer's dementia-doing well on Aricept without any  side effects. We'll continue current regimen. Followup in 6 months.  Hyperlipidemia-due for CMP and fasting lipid panel. Discussed importance of lab work to make sure this medication is safe for her.  Macular degeneration-being followed monthly by her retina specialist.  Anterior cervical lymphadenopathy-it is asymmetric. She has had some allergy symptoms recently with a runny nose and some mild congestion. She says she typically has allergies on and off throughout the year. She denies any recent colds or sore throat or fever. All check a CBC on her blood work I would like to see her back in one month to recheck the lymph node.

## 2013-05-03 ENCOUNTER — Other Ambulatory Visit: Payer: Self-pay | Admitting: Family Medicine

## 2013-05-04 DIAGNOSIS — H35059 Retinal neovascularization, unspecified, unspecified eye: Secondary | ICD-10-CM | POA: Diagnosis not present

## 2013-05-04 DIAGNOSIS — H31019 Macula scars of posterior pole (postinflammatory) (post-traumatic), unspecified eye: Secondary | ICD-10-CM | POA: Diagnosis not present

## 2013-05-04 DIAGNOSIS — H43819 Vitreous degeneration, unspecified eye: Secondary | ICD-10-CM | POA: Diagnosis not present

## 2013-05-04 DIAGNOSIS — H353290 Exudative age-related macular degeneration, unspecified eye, stage unspecified: Secondary | ICD-10-CM | POA: Diagnosis not present

## 2013-05-15 ENCOUNTER — Other Ambulatory Visit: Payer: Self-pay | Admitting: Family Medicine

## 2013-05-17 ENCOUNTER — Ambulatory Visit (INDEPENDENT_AMBULATORY_CARE_PROVIDER_SITE_OTHER): Payer: Medicare Other

## 2013-05-17 ENCOUNTER — Other Ambulatory Visit: Payer: Self-pay | Admitting: Family Medicine

## 2013-05-17 DIAGNOSIS — R599 Enlarged lymph nodes, unspecified: Secondary | ICD-10-CM | POA: Diagnosis not present

## 2013-05-17 DIAGNOSIS — M810 Age-related osteoporosis without current pathological fracture: Secondary | ICD-10-CM | POA: Diagnosis not present

## 2013-05-17 DIAGNOSIS — N951 Menopausal and female climacteric states: Secondary | ICD-10-CM

## 2013-05-17 DIAGNOSIS — E785 Hyperlipidemia, unspecified: Secondary | ICD-10-CM | POA: Diagnosis not present

## 2013-05-17 LAB — CBC WITH DIFFERENTIAL/PLATELET
Basophils Absolute: 0 10*3/uL (ref 0.0–0.1)
Eosinophils Absolute: 0.1 10*3/uL (ref 0.0–0.7)
Eosinophils Relative: 2 % (ref 0–5)
HCT: 44 % (ref 36.0–46.0)
Lymphocytes Relative: 50 % — ABNORMAL HIGH (ref 12–46)
Lymphs Abs: 2.2 10*3/uL (ref 0.7–4.0)
MCH: 31.1 pg (ref 26.0–34.0)
MCV: 90 fL (ref 78.0–100.0)
Monocytes Absolute: 0.3 10*3/uL (ref 0.1–1.0)
Platelets: 270 10*3/uL (ref 150–400)
RDW: 14.3 % (ref 11.5–15.5)
WBC: 4.5 10*3/uL (ref 4.0–10.5)

## 2013-05-17 LAB — COMPLETE METABOLIC PANEL WITH GFR
Alkaline Phosphatase: 53 U/L (ref 39–117)
BUN: 10 mg/dL (ref 6–23)
CO2: 25 mEq/L (ref 19–32)
Calcium: 9 mg/dL (ref 8.4–10.5)
Chloride: 105 mEq/L (ref 96–112)
Creat: 0.76 mg/dL (ref 0.50–1.10)
GFR, Est African American: 81 mL/min
GFR, Est Non African American: 70 mL/min
Glucose, Bld: 83 mg/dL (ref 70–99)
Total Bilirubin: 0.7 mg/dL (ref 0.3–1.2)
Total Protein: 6.6 g/dL (ref 6.0–8.3)

## 2013-05-17 LAB — LIPID PANEL
Cholesterol: 175 mg/dL (ref 0–200)
HDL: 32 mg/dL — ABNORMAL LOW (ref >39)

## 2013-05-19 ENCOUNTER — Encounter: Payer: Self-pay | Admitting: Family Medicine

## 2013-05-19 DIAGNOSIS — M810 Age-related osteoporosis without current pathological fracture: Secondary | ICD-10-CM | POA: Insufficient documentation

## 2013-06-10 DIAGNOSIS — H31019 Macula scars of posterior pole (postinflammatory) (post-traumatic), unspecified eye: Secondary | ICD-10-CM | POA: Diagnosis not present

## 2013-06-10 DIAGNOSIS — H353290 Exudative age-related macular degeneration, unspecified eye, stage unspecified: Secondary | ICD-10-CM | POA: Diagnosis not present

## 2013-06-10 DIAGNOSIS — H35059 Retinal neovascularization, unspecified, unspecified eye: Secondary | ICD-10-CM | POA: Diagnosis not present

## 2013-07-15 DIAGNOSIS — H35059 Retinal neovascularization, unspecified, unspecified eye: Secondary | ICD-10-CM | POA: Diagnosis not present

## 2013-07-15 DIAGNOSIS — H353290 Exudative age-related macular degeneration, unspecified eye, stage unspecified: Secondary | ICD-10-CM | POA: Diagnosis not present

## 2013-07-29 ENCOUNTER — Other Ambulatory Visit: Payer: Self-pay | Admitting: Family Medicine

## 2013-08-01 DIAGNOSIS — E7800 Pure hypercholesterolemia, unspecified: Secondary | ICD-10-CM | POA: Diagnosis not present

## 2013-08-01 DIAGNOSIS — Z79899 Other long term (current) drug therapy: Secondary | ICD-10-CM | POA: Diagnosis not present

## 2013-08-01 DIAGNOSIS — K625 Hemorrhage of anus and rectum: Secondary | ICD-10-CM | POA: Diagnosis not present

## 2013-08-01 DIAGNOSIS — K6289 Other specified diseases of anus and rectum: Secondary | ICD-10-CM | POA: Diagnosis not present

## 2013-08-01 DIAGNOSIS — G309 Alzheimer's disease, unspecified: Secondary | ICD-10-CM | POA: Diagnosis not present

## 2013-08-01 DIAGNOSIS — F0280 Dementia in other diseases classified elsewhere without behavioral disturbance: Secondary | ICD-10-CM | POA: Diagnosis not present

## 2013-08-01 DIAGNOSIS — Z87891 Personal history of nicotine dependence: Secondary | ICD-10-CM | POA: Diagnosis not present

## 2013-08-08 ENCOUNTER — Other Ambulatory Visit: Payer: Self-pay | Admitting: Family Medicine

## 2013-08-29 DIAGNOSIS — H353290 Exudative age-related macular degeneration, unspecified eye, stage unspecified: Secondary | ICD-10-CM | POA: Diagnosis not present

## 2013-08-29 DIAGNOSIS — H35059 Retinal neovascularization, unspecified, unspecified eye: Secondary | ICD-10-CM | POA: Diagnosis not present

## 2013-08-29 DIAGNOSIS — H31019 Macula scars of posterior pole (postinflammatory) (post-traumatic), unspecified eye: Secondary | ICD-10-CM | POA: Diagnosis not present

## 2013-09-30 DIAGNOSIS — H353290 Exudative age-related macular degeneration, unspecified eye, stage unspecified: Secondary | ICD-10-CM | POA: Diagnosis not present

## 2013-09-30 DIAGNOSIS — H35059 Retinal neovascularization, unspecified, unspecified eye: Secondary | ICD-10-CM | POA: Diagnosis not present

## 2013-10-28 DIAGNOSIS — H31019 Macula scars of posterior pole (postinflammatory) (post-traumatic), unspecified eye: Secondary | ICD-10-CM | POA: Diagnosis not present

## 2013-10-28 DIAGNOSIS — H43819 Vitreous degeneration, unspecified eye: Secondary | ICD-10-CM | POA: Diagnosis not present

## 2013-10-28 DIAGNOSIS — H353290 Exudative age-related macular degeneration, unspecified eye, stage unspecified: Secondary | ICD-10-CM | POA: Diagnosis not present

## 2013-10-28 DIAGNOSIS — H35059 Retinal neovascularization, unspecified, unspecified eye: Secondary | ICD-10-CM | POA: Diagnosis not present

## 2013-11-03 ENCOUNTER — Other Ambulatory Visit: Payer: Self-pay | Admitting: Family Medicine

## 2013-11-25 DIAGNOSIS — H35059 Retinal neovascularization, unspecified, unspecified eye: Secondary | ICD-10-CM | POA: Diagnosis not present

## 2013-11-25 DIAGNOSIS — H43819 Vitreous degeneration, unspecified eye: Secondary | ICD-10-CM | POA: Diagnosis not present

## 2013-11-25 DIAGNOSIS — H31019 Macula scars of posterior pole (postinflammatory) (post-traumatic), unspecified eye: Secondary | ICD-10-CM | POA: Diagnosis not present

## 2013-11-25 DIAGNOSIS — H353290 Exudative age-related macular degeneration, unspecified eye, stage unspecified: Secondary | ICD-10-CM | POA: Diagnosis not present

## 2013-12-23 DIAGNOSIS — H35059 Retinal neovascularization, unspecified, unspecified eye: Secondary | ICD-10-CM | POA: Diagnosis not present

## 2013-12-23 DIAGNOSIS — H353290 Exudative age-related macular degeneration, unspecified eye, stage unspecified: Secondary | ICD-10-CM | POA: Diagnosis not present

## 2014-01-01 DIAGNOSIS — R079 Chest pain, unspecified: Secondary | ICD-10-CM | POA: Diagnosis not present

## 2014-01-01 DIAGNOSIS — M79609 Pain in unspecified limb: Secondary | ICD-10-CM | POA: Diagnosis not present

## 2014-01-01 DIAGNOSIS — S79919A Unspecified injury of unspecified hip, initial encounter: Secondary | ICD-10-CM | POA: Diagnosis not present

## 2014-01-01 DIAGNOSIS — S72143A Displaced intertrochanteric fracture of unspecified femur, initial encounter for closed fracture: Secondary | ICD-10-CM | POA: Diagnosis not present

## 2014-01-01 DIAGNOSIS — S79929A Unspecified injury of unspecified thigh, initial encounter: Secondary | ICD-10-CM | POA: Diagnosis not present

## 2014-01-01 DIAGNOSIS — Z79899 Other long term (current) drug therapy: Secondary | ICD-10-CM | POA: Diagnosis not present

## 2014-01-01 DIAGNOSIS — I658 Occlusion and stenosis of other precerebral arteries: Secondary | ICD-10-CM | POA: Diagnosis not present

## 2014-01-01 DIAGNOSIS — S4980XA Other specified injuries of shoulder and upper arm, unspecified arm, initial encounter: Secondary | ICD-10-CM | POA: Diagnosis not present

## 2014-01-01 DIAGNOSIS — I6529 Occlusion and stenosis of unspecified carotid artery: Secondary | ICD-10-CM | POA: Diagnosis not present

## 2014-01-01 DIAGNOSIS — R269 Unspecified abnormalities of gait and mobility: Secondary | ICD-10-CM | POA: Diagnosis not present

## 2014-01-01 DIAGNOSIS — M25559 Pain in unspecified hip: Secondary | ICD-10-CM | POA: Diagnosis not present

## 2014-01-01 DIAGNOSIS — S298XXA Other specified injuries of thorax, initial encounter: Secondary | ICD-10-CM | POA: Diagnosis not present

## 2014-01-01 DIAGNOSIS — S8000XA Contusion of unspecified knee, initial encounter: Secondary | ICD-10-CM | POA: Diagnosis not present

## 2014-01-01 DIAGNOSIS — S46909A Unspecified injury of unspecified muscle, fascia and tendon at shoulder and upper arm level, unspecified arm, initial encounter: Secondary | ICD-10-CM | POA: Diagnosis not present

## 2014-01-01 DIAGNOSIS — S0990XA Unspecified injury of head, initial encounter: Secondary | ICD-10-CM | POA: Diagnosis not present

## 2014-01-01 DIAGNOSIS — S199XXA Unspecified injury of neck, initial encounter: Secondary | ICD-10-CM | POA: Diagnosis not present

## 2014-01-01 DIAGNOSIS — R296 Repeated falls: Secondary | ICD-10-CM | POA: Diagnosis not present

## 2014-01-01 DIAGNOSIS — F0280 Dementia in other diseases classified elsewhere without behavioral disturbance: Secondary | ICD-10-CM | POA: Diagnosis not present

## 2014-01-01 DIAGNOSIS — M67919 Unspecified disorder of synovium and tendon, unspecified shoulder: Secondary | ICD-10-CM | POA: Diagnosis not present

## 2014-01-01 DIAGNOSIS — M19019 Primary osteoarthritis, unspecified shoulder: Secondary | ICD-10-CM | POA: Diagnosis not present

## 2014-01-01 DIAGNOSIS — G309 Alzheimer's disease, unspecified: Secondary | ICD-10-CM | POA: Diagnosis not present

## 2014-01-01 DIAGNOSIS — R5381 Other malaise: Secondary | ICD-10-CM | POA: Diagnosis not present

## 2014-01-01 DIAGNOSIS — E785 Hyperlipidemia, unspecified: Secondary | ICD-10-CM | POA: Diagnosis not present

## 2014-01-01 DIAGNOSIS — T148XXA Other injury of unspecified body region, initial encounter: Secondary | ICD-10-CM | POA: Diagnosis not present

## 2014-01-01 DIAGNOSIS — R52 Pain, unspecified: Secondary | ICD-10-CM | POA: Diagnosis not present

## 2014-01-01 DIAGNOSIS — S0993XA Unspecified injury of face, initial encounter: Secondary | ICD-10-CM | POA: Diagnosis not present

## 2014-01-01 DIAGNOSIS — S42253A Displaced fracture of greater tuberosity of unspecified humerus, initial encounter for closed fracture: Secondary | ICD-10-CM | POA: Diagnosis not present

## 2014-01-01 DIAGNOSIS — M719 Bursopathy, unspecified: Secondary | ICD-10-CM | POA: Diagnosis not present

## 2014-01-01 DIAGNOSIS — Z66 Do not resuscitate: Secondary | ICD-10-CM | POA: Diagnosis not present

## 2014-01-01 LAB — CBC AND DIFFERENTIAL
Hemoglobin: 15.4 g/dL (ref 12.0–16.0)
PLATELETS: 255 10*3/uL (ref 150–399)
WBC: 8.5 10^3/mL

## 2014-01-01 LAB — HEPATIC FUNCTION PANEL
ALT: 13 U/L (ref 7–35)
AST: 34 U/L (ref 13–35)
Alkaline Phosphatase: 59 U/L (ref 25–125)
Bilirubin, Total: 0.8 mg/dL

## 2014-01-01 LAB — BASIC METABOLIC PANEL
CREATININE: 0.7 mg/dL (ref 0.5–1.1)
Sodium: 141 mmol/L (ref 137–147)

## 2014-01-02 DIAGNOSIS — E785 Hyperlipidemia, unspecified: Secondary | ICD-10-CM | POA: Diagnosis not present

## 2014-01-02 DIAGNOSIS — R55 Syncope and collapse: Secondary | ICD-10-CM | POA: Diagnosis not present

## 2014-01-02 DIAGNOSIS — R296 Repeated falls: Secondary | ICD-10-CM | POA: Diagnosis not present

## 2014-01-02 DIAGNOSIS — I6529 Occlusion and stenosis of unspecified carotid artery: Secondary | ICD-10-CM | POA: Diagnosis not present

## 2014-01-02 DIAGNOSIS — M25559 Pain in unspecified hip: Secondary | ICD-10-CM | POA: Diagnosis not present

## 2014-01-02 DIAGNOSIS — M25519 Pain in unspecified shoulder: Secondary | ICD-10-CM | POA: Diagnosis not present

## 2014-01-02 DIAGNOSIS — S79919A Unspecified injury of unspecified hip, initial encounter: Secondary | ICD-10-CM | POA: Diagnosis not present

## 2014-01-02 DIAGNOSIS — F0280 Dementia in other diseases classified elsewhere without behavioral disturbance: Secondary | ICD-10-CM | POA: Diagnosis not present

## 2014-01-03 DIAGNOSIS — S42253A Displaced fracture of greater tuberosity of unspecified humerus, initial encounter for closed fracture: Secondary | ICD-10-CM | POA: Diagnosis not present

## 2014-01-03 DIAGNOSIS — IMO0002 Reserved for concepts with insufficient information to code with codable children: Secondary | ICD-10-CM | POA: Diagnosis not present

## 2014-01-03 DIAGNOSIS — E785 Hyperlipidemia, unspecified: Secondary | ICD-10-CM | POA: Diagnosis not present

## 2014-01-03 DIAGNOSIS — M25519 Pain in unspecified shoulder: Secondary | ICD-10-CM | POA: Diagnosis not present

## 2014-01-03 DIAGNOSIS — F0280 Dementia in other diseases classified elsewhere without behavioral disturbance: Secondary | ICD-10-CM | POA: Diagnosis not present

## 2014-01-03 DIAGNOSIS — M75100 Unspecified rotator cuff tear or rupture of unspecified shoulder, not specified as traumatic: Secondary | ICD-10-CM | POA: Diagnosis not present

## 2014-01-03 DIAGNOSIS — R52 Pain, unspecified: Secondary | ICD-10-CM | POA: Diagnosis not present

## 2014-01-04 DIAGNOSIS — F0280 Dementia in other diseases classified elsewhere without behavioral disturbance: Secondary | ICD-10-CM | POA: Diagnosis not present

## 2014-01-04 DIAGNOSIS — E785 Hyperlipidemia, unspecified: Secondary | ICD-10-CM | POA: Diagnosis not present

## 2014-01-04 DIAGNOSIS — R52 Pain, unspecified: Secondary | ICD-10-CM | POA: Diagnosis not present

## 2014-01-04 DIAGNOSIS — S42253A Displaced fracture of greater tuberosity of unspecified humerus, initial encounter for closed fracture: Secondary | ICD-10-CM | POA: Diagnosis not present

## 2014-01-04 DIAGNOSIS — M25519 Pain in unspecified shoulder: Secondary | ICD-10-CM | POA: Diagnosis not present

## 2014-01-11 ENCOUNTER — Telehealth: Payer: Self-pay

## 2014-01-11 NOTE — Telephone Encounter (Signed)
Fayrene FearingJames called and wanted to get an understanding of what Mrs. Wall appointment for 01/13/2014 @2 :00 was for. I explained to him that it is for a hospital F/U for a hip fx. He stated that she doesn't have a hip fx, she does have a hairline fx in her shoulder. They would rather not do a hospital f/u but do her 6 month f/u instead, but if you would just look at her shoulder./ Alva GarnetStacy Devantae Babe,CMA

## 2014-01-12 NOTE — Telephone Encounter (Signed)
That is fine. Let's keep it on the schedule at hosp F/U.  It's a 30 min slot so we should have time to address her cholesterol and dementia

## 2014-01-13 ENCOUNTER — Encounter: Payer: Self-pay | Admitting: Family Medicine

## 2014-01-13 ENCOUNTER — Ambulatory Visit (INDEPENDENT_AMBULATORY_CARE_PROVIDER_SITE_OTHER): Payer: Medicare Other | Admitting: Family Medicine

## 2014-01-13 ENCOUNTER — Telehealth: Payer: Self-pay | Admitting: *Deleted

## 2014-01-13 VITALS — BP 114/75 | HR 89 | Wt 178.0 lb

## 2014-01-13 DIAGNOSIS — G309 Alzheimer's disease, unspecified: Principal | ICD-10-CM

## 2014-01-13 DIAGNOSIS — E785 Hyperlipidemia, unspecified: Secondary | ICD-10-CM

## 2014-01-13 DIAGNOSIS — F0280 Dementia in other diseases classified elsewhere without behavioral disturbance: Secondary | ICD-10-CM | POA: Diagnosis not present

## 2014-01-13 DIAGNOSIS — S4291XS Fracture of right shoulder girdle, part unspecified, sequela: Secondary | ICD-10-CM

## 2014-01-13 MED ORDER — DONEPEZIL HCL 10 MG PO TABS
ORAL_TABLET | ORAL | Status: DC
Start: 2014-01-13 — End: 2014-12-03

## 2014-01-13 NOTE — Progress Notes (Signed)
Subjective:    Patient ID: Sandra Tucker, female    DOB: 07/19/1922, 80 y.o.   MRN: [id number]  HPI Here today for several reasons including hospital followup. She had a right shoulder fracture after a fall. She does come from the hospital last Friday, 7 days ago. The orthopedist there had recommended a sling but said that she didn't want to wear it it didn't really matter. He said it was a very small fracture it does not require any significant intervention. Her son has noticed that she's actually using her arm a lot more in the last few days and feels that even have better range of motion than she did initially. She has not complained of pain at all and she denies any pain today. She refuses to wear the sling.   She also wants to followup on her chronic problem including her cholesterol and Alzheimer's disease. Just got home Friday night.  Not really wearing a sling.  She is at independent living.  No pain today.   Son really wants to dicuss wether or not to be on cholesterol meds  she's not currently taking any pain medications.  Review of Systems     Objective:   Physical Exam  Constitutional: She is oriented to person, place, and time. She appears well-developed and well-nourished.  HENT:  Head: Normocephalic and atraumatic.  Cardiovascular: Normal rate, regular rhythm and normal heart sounds.   Pulmonary/Chest: Effort normal and breath sounds normal.  Neurological: She is alert and oriented to person, place, and time.  Skin: Skin is warm and dry.  Psychiatric: She has a normal mood and affect. Her behavior is normal.    Right shoulder with abduction to about 90. She started to experience pain and she started passing through 45. She's able to reach over and The opposite shoulder. She was also able to put her hand on her hip without any difficulty.      Assessment & Plan:  Alzheimer's- Still on Aricept. We discussed the pros and cons of continuing this medication.  It does come  generic now so her son would like to continue it. Followup in 6 months.  Hyperlipidemia-we had a long discussion about the risks and benefits of continuing a statin. She does have a diagnosis of hyperlipidemia but no prior history of heart disease or stroke. If also unclear whether not she might be on a statin because of cerebral vascular disease. I'm not sure if this was at all related to her dementia or not it might have even been part of the reason she was started on a statin. She was on these medications and [place] After much discussion we decided to stop the fenofibrate.   Her son will think about the pravastatin and let me know what he would like to do before she is due for another refill. Lab Results  Component Value Date   CHOL 175 05/17/2013   HDL 32* 05/17/2013   LDLCALC 101* 05/17/2013   TRIG 211* 05/17/2013   CHOLHDL 5.5 05/17/2013   Right shoulder fracture-will call [facility] to get a copy of records and a copy of the report for the fracture. It sounds like the fracture was fairly minor and that her activity is only limited by pain. The orthopedist had explained that it usually would take about 3-4 weeks to completely heal. He also recommended therapy only if she is not improving or if she  suddenly gets worse. I encouraged her son to monitor for any favoring of that arm or if she stops using it completely. Also let us know if she starts complaining of pain.

## 2014-01-13 NOTE — Telephone Encounter (Signed)
Called and spoke w/Robert and he informed me that their facility does not provide any type of vaccinations for the residents.Laureen Ochs.Ottis Vacha, Viann Shoveonya Lynetta

## 2014-01-15 NOTE — Telephone Encounter (Signed)
Please call son and let him know that they don't keep records of vaccination as they are done through independent provider. Maybe we can make sure pneumonia shot is up date at next OV.

## 2014-01-17 NOTE — Telephone Encounter (Signed)
Pt's son informed .Sandra Tucker  

## 2014-01-20 DIAGNOSIS — H31019 Macula scars of posterior pole (postinflammatory) (post-traumatic), unspecified eye: Secondary | ICD-10-CM | POA: Diagnosis not present

## 2014-01-20 DIAGNOSIS — H35059 Retinal neovascularization, unspecified, unspecified eye: Secondary | ICD-10-CM | POA: Diagnosis not present

## 2014-01-20 DIAGNOSIS — H353290 Exudative age-related macular degeneration, unspecified eye, stage unspecified: Secondary | ICD-10-CM | POA: Diagnosis not present

## 2014-02-24 DIAGNOSIS — H353290 Exudative age-related macular degeneration, unspecified eye, stage unspecified: Secondary | ICD-10-CM | POA: Diagnosis not present

## 2014-02-24 DIAGNOSIS — H35059 Retinal neovascularization, unspecified, unspecified eye: Secondary | ICD-10-CM | POA: Diagnosis not present

## 2014-02-24 DIAGNOSIS — H43819 Vitreous degeneration, unspecified eye: Secondary | ICD-10-CM | POA: Diagnosis not present

## 2014-03-10 DIAGNOSIS — Z23 Encounter for immunization: Secondary | ICD-10-CM | POA: Diagnosis not present

## 2014-03-24 DIAGNOSIS — H353290 Exudative age-related macular degeneration, unspecified eye, stage unspecified: Secondary | ICD-10-CM | POA: Diagnosis not present

## 2014-03-24 DIAGNOSIS — H35059 Retinal neovascularization, unspecified, unspecified eye: Secondary | ICD-10-CM | POA: Diagnosis not present

## 2014-04-21 DIAGNOSIS — H31011 Macula scars of posterior pole (postinflammatory) (post-traumatic), right eye: Secondary | ICD-10-CM | POA: Diagnosis not present

## 2014-04-21 DIAGNOSIS — H35052 Retinal neovascularization, unspecified, left eye: Secondary | ICD-10-CM | POA: Diagnosis not present

## 2014-04-21 DIAGNOSIS — H3532 Exudative age-related macular degeneration: Secondary | ICD-10-CM | POA: Diagnosis not present

## 2014-05-26 DIAGNOSIS — H35052 Retinal neovascularization, unspecified, left eye: Secondary | ICD-10-CM | POA: Diagnosis not present

## 2014-05-26 DIAGNOSIS — H3532 Exudative age-related macular degeneration: Secondary | ICD-10-CM | POA: Diagnosis not present

## 2014-05-26 DIAGNOSIS — H43813 Vitreous degeneration, bilateral: Secondary | ICD-10-CM | POA: Diagnosis not present

## 2014-05-26 DIAGNOSIS — H31011 Macula scars of posterior pole (postinflammatory) (post-traumatic), right eye: Secondary | ICD-10-CM | POA: Diagnosis not present

## 2014-06-23 DIAGNOSIS — H3532 Exudative age-related macular degeneration: Secondary | ICD-10-CM | POA: Diagnosis not present

## 2014-06-23 DIAGNOSIS — H31011 Macula scars of posterior pole (postinflammatory) (post-traumatic), right eye: Secondary | ICD-10-CM | POA: Diagnosis not present

## 2014-07-21 DIAGNOSIS — H35052 Retinal neovascularization, unspecified, left eye: Secondary | ICD-10-CM | POA: Diagnosis not present

## 2014-07-21 DIAGNOSIS — H31011 Macula scars of posterior pole (postinflammatory) (post-traumatic), right eye: Secondary | ICD-10-CM | POA: Diagnosis not present

## 2014-07-21 DIAGNOSIS — H43813 Vitreous degeneration, bilateral: Secondary | ICD-10-CM | POA: Diagnosis not present

## 2014-07-21 DIAGNOSIS — H3532 Exudative age-related macular degeneration: Secondary | ICD-10-CM | POA: Diagnosis not present

## 2014-08-02 ENCOUNTER — Encounter: Payer: Self-pay | Admitting: Family Medicine

## 2014-08-18 DIAGNOSIS — H35052 Retinal neovascularization, unspecified, left eye: Secondary | ICD-10-CM | POA: Diagnosis not present

## 2014-08-18 DIAGNOSIS — H43813 Vitreous degeneration, bilateral: Secondary | ICD-10-CM | POA: Diagnosis not present

## 2014-08-18 DIAGNOSIS — H31011 Macula scars of posterior pole (postinflammatory) (post-traumatic), right eye: Secondary | ICD-10-CM | POA: Diagnosis not present

## 2014-08-18 DIAGNOSIS — H3532 Exudative age-related macular degeneration: Secondary | ICD-10-CM | POA: Diagnosis not present

## 2014-09-15 DIAGNOSIS — H31011 Macula scars of posterior pole (postinflammatory) (post-traumatic), right eye: Secondary | ICD-10-CM | POA: Diagnosis not present

## 2014-09-15 DIAGNOSIS — H43813 Vitreous degeneration, bilateral: Secondary | ICD-10-CM | POA: Diagnosis not present

## 2014-09-15 DIAGNOSIS — H3532 Exudative age-related macular degeneration: Secondary | ICD-10-CM | POA: Diagnosis not present

## 2014-09-15 DIAGNOSIS — H35052 Retinal neovascularization, unspecified, left eye: Secondary | ICD-10-CM | POA: Diagnosis not present

## 2014-10-18 DIAGNOSIS — H35052 Retinal neovascularization, unspecified, left eye: Secondary | ICD-10-CM | POA: Diagnosis not present

## 2014-10-18 DIAGNOSIS — H3532 Exudative age-related macular degeneration: Secondary | ICD-10-CM | POA: Diagnosis not present

## 2014-10-18 DIAGNOSIS — H43813 Vitreous degeneration, bilateral: Secondary | ICD-10-CM | POA: Diagnosis not present

## 2014-11-17 DIAGNOSIS — H3532 Exudative age-related macular degeneration: Secondary | ICD-10-CM | POA: Diagnosis not present

## 2014-11-17 DIAGNOSIS — H31011 Macula scars of posterior pole (postinflammatory) (post-traumatic), right eye: Secondary | ICD-10-CM | POA: Diagnosis not present

## 2014-11-17 DIAGNOSIS — H43813 Vitreous degeneration, bilateral: Secondary | ICD-10-CM | POA: Diagnosis not present

## 2014-11-17 DIAGNOSIS — H35052 Retinal neovascularization, unspecified, left eye: Secondary | ICD-10-CM | POA: Diagnosis not present

## 2014-12-03 ENCOUNTER — Other Ambulatory Visit: Payer: Self-pay | Admitting: Family Medicine

## 2014-12-22 DIAGNOSIS — H35052 Retinal neovascularization, unspecified, left eye: Secondary | ICD-10-CM | POA: Diagnosis not present

## 2014-12-22 DIAGNOSIS — H43813 Vitreous degeneration, bilateral: Secondary | ICD-10-CM | POA: Diagnosis not present

## 2014-12-22 DIAGNOSIS — H31011 Macula scars of posterior pole (postinflammatory) (post-traumatic), right eye: Secondary | ICD-10-CM | POA: Diagnosis not present

## 2014-12-22 DIAGNOSIS — H3532 Exudative age-related macular degeneration: Secondary | ICD-10-CM | POA: Diagnosis not present

## 2015-01-19 DIAGNOSIS — H35052 Retinal neovascularization, unspecified, left eye: Secondary | ICD-10-CM | POA: Diagnosis not present

## 2015-01-19 DIAGNOSIS — H3532 Exudative age-related macular degeneration: Secondary | ICD-10-CM | POA: Diagnosis not present

## 2015-01-19 DIAGNOSIS — H43813 Vitreous degeneration, bilateral: Secondary | ICD-10-CM | POA: Diagnosis not present

## 2015-01-19 DIAGNOSIS — H31011 Macula scars of posterior pole (postinflammatory) (post-traumatic), right eye: Secondary | ICD-10-CM | POA: Diagnosis not present

## 2015-01-26 DIAGNOSIS — H43812 Vitreous degeneration, left eye: Secondary | ICD-10-CM | POA: Diagnosis not present

## 2015-01-26 DIAGNOSIS — H2511 Age-related nuclear cataract, right eye: Secondary | ICD-10-CM | POA: Diagnosis not present

## 2015-01-26 DIAGNOSIS — H02839 Dermatochalasis of unspecified eye, unspecified eyelid: Secondary | ICD-10-CM | POA: Diagnosis not present

## 2015-01-26 DIAGNOSIS — H35372 Puckering of macula, left eye: Secondary | ICD-10-CM | POA: Diagnosis not present

## 2015-01-26 DIAGNOSIS — H4011X0 Primary open-angle glaucoma, stage unspecified: Secondary | ICD-10-CM | POA: Diagnosis not present

## 2015-02-09 DIAGNOSIS — H35052 Retinal neovascularization, unspecified, left eye: Secondary | ICD-10-CM | POA: Diagnosis not present

## 2015-02-09 DIAGNOSIS — H3532 Exudative age-related macular degeneration: Secondary | ICD-10-CM | POA: Diagnosis not present

## 2015-02-09 DIAGNOSIS — H31011 Macula scars of posterior pole (postinflammatory) (post-traumatic), right eye: Secondary | ICD-10-CM | POA: Diagnosis not present

## 2015-03-02 ENCOUNTER — Encounter: Payer: Self-pay | Admitting: Family Medicine

## 2015-03-02 ENCOUNTER — Ambulatory Visit (INDEPENDENT_AMBULATORY_CARE_PROVIDER_SITE_OTHER): Payer: Medicare Other | Admitting: Family Medicine

## 2015-03-02 VITALS — BP 149/87 | HR 65 | Temp 98.1°F | Wt 172.0 lb

## 2015-03-02 DIAGNOSIS — R6889 Other general symptoms and signs: Secondary | ICD-10-CM

## 2015-03-02 DIAGNOSIS — R198 Other specified symptoms and signs involving the digestive system and abdomen: Secondary | ICD-10-CM | POA: Diagnosis not present

## 2015-03-02 DIAGNOSIS — R42 Dizziness and giddiness: Secondary | ICD-10-CM | POA: Diagnosis not present

## 2015-03-02 DIAGNOSIS — J309 Allergic rhinitis, unspecified: Secondary | ICD-10-CM

## 2015-03-02 DIAGNOSIS — R0989 Other specified symptoms and signs involving the circulatory and respiratory systems: Secondary | ICD-10-CM

## 2015-03-02 NOTE — Patient Instructions (Signed)
Can try flonase -  1 spray in each nostril daily for 4 weeks and see if improves cough and throat clearing.

## 2015-03-02 NOTE — Progress Notes (Addendum)
   Subjective:    Patient ID: Sandra Tucker, female    DOB: 1923-02-22, 81 y.o.   MRN: [id number]  HPI 81 year old female with a history of Alzheimer's who lives then an assisted living facility comes in today brought by her son.  pts son stated that the facility called him this morning and advised that she told the nurse that she felt lightheaded and she returned to her room. They checked her blood pressure there and it was normal. He says that she normally does not complain and also for her to complain of something is unusual. He says that she has a good appetite and eats well. Bowels are moving normally.  No urianry sxs. No fevers chills or sweats. No recent illnesses. She says she's feeling fine now and denies any symptoms currently. Unfortunately her all summer's advances she is not a good historian.   No HA and no ear pain. Has seasonal allergies and has a lot of phlegm production in her throat. Prior smoker. She has constant throat clearing but this is not new.  They did try an histamine at one time but it really didn't help.   Review of Systems     Objective:   Physical Exam  Constitutional: She is oriented to person, place, and time. She appears well-developed and well-nourished.  HENT:  Head: Normocephalic and atraumatic.  Right Ear: External ear normal.  Left Ear: External ear normal.  Nose: Nose normal.  Mouth/Throat: Oropharynx is clear and moist.  TMs and canals are clear.   Eyes: Conjunctivae and EOM are normal. Pupils are equal, round, and reactive to light.  Neck: Neck supple. No thyromegaly present.  Cardiovascular: Normal rate, regular rhythm and normal heart sounds.   Pulmonary/Chest: Effort normal and breath sounds normal. She has no wheezes.  Abdominal: Soft. Bowel sounds are normal. She exhibits no distension. There is no tenderness. There is no rebound and no guarding.  Musculoskeletal: She exhibits no edema.  Lymphadenopathy:    She has no cervical adenopathy.   Neurological: She is alert and oriented to person, place, and time. No cranial nerve deficit.  Skin: Skin is warm and dry.  Psychiatric: She has a normal mood and affect.     Cerumen clear with irrigation. TMS and canals are clear after irrigation. No fluid.      Assessment & Plan:  Dizziness-unclear etiology. She is completely asymptomatic right now. Her exam is completely normal. Her ear canals were blocked by cerumen. We'll have her go ahead and do a urinalysis today as well. No cardiac symptoms or arrhythmia on exam. Consider CBC, CMP, TSH, etc ifnot improvement.. No weakness on exam today.   Allergic rhinitis vs chronic throat clearing -recommend trial of a nasal steroid for 4 weeks. If not improving then can discontinue. This might help with the constant throat clearing.

## 2015-03-09 ENCOUNTER — Other Ambulatory Visit: Payer: Self-pay | Admitting: Family Medicine

## 2015-03-09 DIAGNOSIS — H3532 Exudative age-related macular degeneration: Secondary | ICD-10-CM | POA: Diagnosis not present

## 2015-03-09 DIAGNOSIS — H43813 Vitreous degeneration, bilateral: Secondary | ICD-10-CM | POA: Diagnosis not present

## 2015-03-09 DIAGNOSIS — H35052 Retinal neovascularization, unspecified, left eye: Secondary | ICD-10-CM | POA: Diagnosis not present

## 2015-03-09 DIAGNOSIS — H31011 Macula scars of posterior pole (postinflammatory) (post-traumatic), right eye: Secondary | ICD-10-CM | POA: Diagnosis not present

## 2015-04-09 DIAGNOSIS — H353221 Exudative age-related macular degeneration, left eye, with active choroidal neovascularization: Secondary | ICD-10-CM | POA: Diagnosis not present

## 2015-04-09 DIAGNOSIS — H353213 Exudative age-related macular degeneration, right eye, with inactive scar: Secondary | ICD-10-CM | POA: Diagnosis not present

## 2015-05-11 DIAGNOSIS — H353221 Exudative age-related macular degeneration, left eye, with active choroidal neovascularization: Secondary | ICD-10-CM | POA: Diagnosis not present

## 2015-05-11 DIAGNOSIS — H353213 Exudative age-related macular degeneration, right eye, with inactive scar: Secondary | ICD-10-CM | POA: Diagnosis not present

## 2015-05-11 DIAGNOSIS — H43813 Vitreous degeneration, bilateral: Secondary | ICD-10-CM | POA: Diagnosis not present

## 2015-06-04 ENCOUNTER — Other Ambulatory Visit: Payer: Self-pay | Admitting: Family Medicine

## 2015-06-22 DIAGNOSIS — H43813 Vitreous degeneration, bilateral: Secondary | ICD-10-CM | POA: Diagnosis not present

## 2015-06-22 DIAGNOSIS — H353213 Exudative age-related macular degeneration, right eye, with inactive scar: Secondary | ICD-10-CM | POA: Diagnosis not present

## 2015-06-22 DIAGNOSIS — H353221 Exudative age-related macular degeneration, left eye, with active choroidal neovascularization: Secondary | ICD-10-CM | POA: Diagnosis not present

## 2015-07-16 ENCOUNTER — Other Ambulatory Visit: Payer: Self-pay | Admitting: Family Medicine

## 2015-07-16 MED ORDER — DONEPEZIL HCL 10 MG PO TABS: 10.0000 mg | ORAL_TABLET | Freq: Every day | ORAL | Status: DC

## 2015-07-16 NOTE — Addendum Note (Signed)
Addended by: Collie SiadICHARDSON, Naiyah Klostermann M on: 07/16/2015 04:33 PM   Modules accepted: Orders

## 2015-07-20 ENCOUNTER — Ambulatory Visit (INDEPENDENT_AMBULATORY_CARE_PROVIDER_SITE_OTHER): Payer: PPO | Admitting: Family Medicine

## 2015-07-20 ENCOUNTER — Encounter: Payer: Self-pay | Admitting: Family Medicine

## 2015-07-20 VITALS — BP 128/77 | HR 81 | Wt 164.0 lb

## 2015-07-20 DIAGNOSIS — F0280 Dementia in other diseases classified elsewhere without behavioral disturbance: Secondary | ICD-10-CM | POA: Diagnosis not present

## 2015-07-20 DIAGNOSIS — G309 Alzheimer's disease, unspecified: Secondary | ICD-10-CM

## 2015-07-20 DIAGNOSIS — R0982 Postnasal drip: Secondary | ICD-10-CM

## 2015-07-20 MED ORDER — AZELASTINE HCL 0.1 % NA SOLN: 2.0000 | Freq: Every day | NASAL | Status: AC

## 2015-07-20 MED ORDER — MEMANTINE HCL-DONEPEZIL HCL ER 7-10 MG PO CP24: 1.0000 | ORAL_CAPSULE | Freq: Every day | ORAL | Status: DC

## 2015-07-20 NOTE — Patient Instructions (Signed)
If she does ok on the Namzaric, we will increase her dose to 14mg  next month. Please call here for refill.

## 2015-07-20 NOTE — Progress Notes (Signed)
   Subjective:    Patient ID: Sandra Tucker, female    DOB: 09/02/1922, 82 y.o.   MRN: [id number]  HPI She is doing well. No falls this year.  Her son says she has noticed some decreased in memory ability over the last year.Marland Kitchen.  Has had some dental problems but is refusing to do anything about it. Her son was trying to get her in with a dentist..  She has been taking her Aricept without any side effects or difficulty. No elevations in blood pressure.  She c/o of a constant drainage.  Has tried OTC decongestant.  Constantly keeps a tissue up t oher mouth.  Little bit of mucous.  Her eye doc thinks it is habit.  It makes it difficult because she clears her throat into tissue and then rubbed her eye with it area there constantly battling eye infections.   Review of Systems     Objective:   Physical Exam  Constitutional: She is oriented to person, place, and time. She appears well-developed and well-nourished.  HENT:  Head: Normocephalic and atraumatic.  Right Ear: External ear normal.  Left Ear: External ear normal.  Nose: Nose normal.  Mouth/Throat: Oropharynx is clear and moist.  TMs and canals are clear.  Very poor dentition.   Eyes: Conjunctivae and EOM are normal. Pupils are equal, round, and reactive to light.  Neck: Neck supple. No thyromegaly present.  Cardiovascular: Normal rate, regular rhythm and normal heart sounds.   Pulmonary/Chest: Effort normal and breath sounds normal. She has no wheezes.  Lymphadenopathy:    She has no cervical adenopathy.  Neurological: She is alert and oriented to person, place, and time.  Skin: Skin is warm and dry.  Psychiatric: She has a normal mood and affect.          Assessment & Plan:  Alzheimer's dementia-we discussed several options. Will add Namenda to Aricept to help maintain her level of functioning. Right now she is in assisted living and has someone to come once a day to help her take her medications and assist with bathing. She says  she does stress herself. She does crossword puzzles for fun. Next  Persistent postnasal drip. Recommend a trial of nasal antihistamine. She will only be able to take it once a day and she only has assistance with medications once a day. If this is not helpful after one month then discontinue. Some of her chronic cough and throat clearing may be habit.  Her son thinks she may have got her flu vaccine in the assisted living center but is not sure so they want to hold off today.

## 2015-07-31 ENCOUNTER — Telehealth: Payer: Self-pay | Admitting: Family Medicine

## 2015-07-31 MED ORDER — DONEPEZIL HCL 10 MG PO TABS: 10.0000 mg | ORAL_TABLET | Freq: Every day | ORAL | Status: DC

## 2015-07-31 MED ORDER — MEMANTINE HCL ER 7 MG PO CP24: ORAL_CAPSULE | ORAL | Status: DC

## 2015-07-31 NOTE — Telephone Encounter (Signed)
Please call patient: Sandra Tucker is not covered by her insurance. So we will do Aricept and Namenda separately.

## 2015-08-01 NOTE — Telephone Encounter (Signed)
Please see note below. Namzaric is not going to be covered by her insurance so we are going to have to do Aricept and Namenda separately. Yes ok to start the [facility]

## 2015-08-01 NOTE — Telephone Encounter (Signed)
Pt's son, Fayrene Fearing, was advised of new Rx's. Pt states he talked with the pharmacist and was advised the Namzaric had the potential side effect of aggression. Son states the Pt recently encounter bed bugs for the second time and they had to move her to a new facility. Would it still be a good idea to start these Rx's now? Will route to PCP.

## 2015-08-01 NOTE — Telephone Encounter (Signed)
Called and spoke with son.  He will try the Namenda and will notify the office if his mother has any problems.

## 2015-08-10 DIAGNOSIS — H43813 Vitreous degeneration, bilateral: Secondary | ICD-10-CM | POA: Diagnosis not present

## 2015-08-10 DIAGNOSIS — H353213 Exudative age-related macular degeneration, right eye, with inactive scar: Secondary | ICD-10-CM | POA: Diagnosis not present

## 2015-08-10 DIAGNOSIS — H353221 Exudative age-related macular degeneration, left eye, with active choroidal neovascularization: Secondary | ICD-10-CM | POA: Diagnosis not present

## 2015-10-02 ENCOUNTER — Other Ambulatory Visit: Payer: Self-pay | Admitting: Family Medicine

## 2015-10-03 DIAGNOSIS — K449 Diaphragmatic hernia without obstruction or gangrene: Secondary | ICD-10-CM | POA: Diagnosis not present

## 2015-10-03 DIAGNOSIS — H3530 Unspecified macular degeneration: Secondary | ICD-10-CM | POA: Diagnosis not present

## 2015-10-03 DIAGNOSIS — H409 Unspecified glaucoma: Secondary | ICD-10-CM | POA: Diagnosis not present

## 2015-10-03 DIAGNOSIS — G301 Alzheimer's disease with late onset: Secondary | ICD-10-CM | POA: Diagnosis not present

## 2015-10-03 DIAGNOSIS — M810 Age-related osteoporosis without current pathological fracture: Secondary | ICD-10-CM | POA: Diagnosis not present

## 2015-10-03 DIAGNOSIS — F39 Unspecified mood [affective] disorder: Secondary | ICD-10-CM | POA: Diagnosis not present

## 2015-10-03 DIAGNOSIS — E785 Hyperlipidemia, unspecified: Secondary | ICD-10-CM | POA: Diagnosis not present

## 2015-10-05 DIAGNOSIS — H35423 Microcystoid degeneration of retina, bilateral: Secondary | ICD-10-CM | POA: Diagnosis not present

## 2015-10-05 DIAGNOSIS — H353211 Exudative age-related macular degeneration, right eye, with active choroidal neovascularization: Secondary | ICD-10-CM | POA: Diagnosis not present

## 2015-10-05 DIAGNOSIS — H353223 Exudative age-related macular degeneration, left eye, with inactive scar: Secondary | ICD-10-CM | POA: Diagnosis not present

## 2015-10-05 DIAGNOSIS — H43813 Vitreous degeneration, bilateral: Secondary | ICD-10-CM | POA: Diagnosis not present

## 2015-11-01 ENCOUNTER — Other Ambulatory Visit: Payer: Self-pay | Admitting: Family Medicine

## 2015-12-07 DIAGNOSIS — H353221 Exudative age-related macular degeneration, left eye, with active choroidal neovascularization: Secondary | ICD-10-CM | POA: Diagnosis not present

## 2015-12-07 DIAGNOSIS — H35423 Microcystoid degeneration of retina, bilateral: Secondary | ICD-10-CM | POA: Diagnosis not present

## 2015-12-07 DIAGNOSIS — H43813 Vitreous degeneration, bilateral: Secondary | ICD-10-CM | POA: Diagnosis not present

## 2015-12-07 DIAGNOSIS — H353213 Exudative age-related macular degeneration, right eye, with inactive scar: Secondary | ICD-10-CM | POA: Diagnosis not present

## 2016-01-20 DIAGNOSIS — M5136 Other intervertebral disc degeneration, lumbar region: Secondary | ICD-10-CM | POA: Diagnosis not present

## 2016-01-20 DIAGNOSIS — Z87891 Personal history of nicotine dependence: Secondary | ICD-10-CM | POA: Diagnosis not present

## 2016-01-20 DIAGNOSIS — G309 Alzheimer's disease, unspecified: Secondary | ICD-10-CM | POA: Diagnosis not present

## 2016-01-20 DIAGNOSIS — Z79899 Other long term (current) drug therapy: Secondary | ICD-10-CM | POA: Diagnosis not present

## 2016-01-20 DIAGNOSIS — F0280 Dementia in other diseases classified elsewhere without behavioral disturbance: Secondary | ICD-10-CM | POA: Diagnosis not present

## 2016-01-20 DIAGNOSIS — M47816 Spondylosis without myelopathy or radiculopathy, lumbar region: Secondary | ICD-10-CM | POA: Diagnosis not present

## 2016-01-20 DIAGNOSIS — M545 Low back pain: Secondary | ICD-10-CM | POA: Diagnosis not present

## 2016-01-20 DIAGNOSIS — I2510 Atherosclerotic heart disease of native coronary artery without angina pectoris: Secondary | ICD-10-CM | POA: Diagnosis not present

## 2016-01-20 DIAGNOSIS — E7800 Pure hypercholesterolemia, unspecified: Secondary | ICD-10-CM | POA: Diagnosis not present

## 2016-01-20 DIAGNOSIS — K8020 Calculus of gallbladder without cholecystitis without obstruction: Secondary | ICD-10-CM | POA: Diagnosis not present

## 2016-01-22 ENCOUNTER — Telehealth: Payer: Self-pay | Admitting: Family Medicine

## 2016-01-22 NOTE — Telephone Encounter (Signed)
Call pt: meta

## 2016-01-29 ENCOUNTER — Encounter: Payer: Self-pay | Admitting: Family Medicine

## 2016-01-29 ENCOUNTER — Ambulatory Visit (INDEPENDENT_AMBULATORY_CARE_PROVIDER_SITE_OTHER): Payer: PPO | Admitting: Family Medicine

## 2016-01-29 VITALS — BP 122/77 | HR 75 | Wt 164.0 lb

## 2016-01-29 DIAGNOSIS — N63 Unspecified lump in unspecified breast: Secondary | ICD-10-CM

## 2016-01-29 DIAGNOSIS — M545 Low back pain, unspecified: Secondary | ICD-10-CM

## 2016-01-29 DIAGNOSIS — G309 Alzheimer's disease, unspecified: Secondary | ICD-10-CM | POA: Diagnosis not present

## 2016-01-29 DIAGNOSIS — K449 Diaphragmatic hernia without obstruction or gangrene: Secondary | ICD-10-CM

## 2016-01-29 DIAGNOSIS — F0280 Dementia in other diseases classified elsewhere without behavioral disturbance: Secondary | ICD-10-CM

## 2016-01-29 NOTE — Progress Notes (Signed)
Subjective:    CC: Alzheimer's dementia  HPI:  Follow-up Alzheimer's dementia-she did try Namenda XR but unfortunately it caused episodic diarrhea and incontinence. They decided to stop it and that resolved. She is still taking Aricept and doing well on it. She's at the point where her memory has advanced where her son feels like she needs to be a nursing home versus assisted living. Right now they pay someone to help her with her medications and to do her eyedrops. He is [facility]. They're doing an evaluation tomorrow and he wanted to let me know that she may need to [facility] completed. She does have some dental issues that would benefit from more soft foods. She does have some very mild incontinence and is able to just wear a pad. She does not need an adult diaper. She does wander at times.  She was also recently seen in the emergency department. She was sitting in a chair the retirement facility and started complaining of back pain. Her son went to check on her when he tried to get her up she started screaming with pain. Also started screaming again when he tried to get her out of the car. Did give her morphine with for pain which she says caused acute delirium. While there she received a CT of the lumbar spine which showed no acute fracture but multilevel degenerative changes. She was also noted to have a large atrial hernia. Her entire stomach and part of her transverse colon are actually in the thoracic cavity. It also showed a fatty liver. As it has hardware in her spine. She also received a CT of the chest which showed a right adrenal myolipoma. It also showed a left breast nodule they recommended diagnostic assessment if not previously performed. She also had an old T7-T9 compression fractures. She was discharged home on diazepam and tramadol. Neither of which she took since going home. Her son reports that the next day she was completely pain-free.  Past medical history,  Surgical history, Family history not pertinant except as noted below, Social history, Allergies, and medications have been entered into the medical record, reviewed, and corrections made.   Review of Systems: No fevers, chills, night sweats, weight loss, chest pain, or shortness of breath.   Objective:    General: Well Developed, well nourished, and in no acute distress.  Neuro: Alert, not oriented, extra-ocular muscles intact, sensation grossly intact.  HEENT: Normocephalic, atraumatic  Skin: Warm and dry, no rashes. Cardiac: Regular rate and rhythm, no murmurs rubs or gallops, no lower extremity edema.  Respiratory: Clear to auscultation bilaterally. Not using accessory muscles, speaking in full sentences. Breast: unable to palpate the lump. O/W normal breast exam.    Impression and Recommendations:   Alzheimer's dementia-did not tolerate Namenda. Will add to her intolerance list. Continue with Aricept. I do think at this point she's progressing to needing to be in nursing home. She no longer knows her son's name.  Left breast nodule, 1.6 cm-we discussed the pros and cons of working this up at age 82. After much discussion they decided they would like to at least do a diagnostic mammogram for further evaluation just to have more information to make a better decision. Next  Extremely large hiatal hernia-recommend no surgical intervention at this point. She is too high risk for repair. Certainly it may make her belch more and have more gas and sometimes even have some epigastric and chest discomfort but unlikely to  really benefit from repair.  Low Back Pain - resolved.

## 2016-01-30 ENCOUNTER — Encounter: Payer: Self-pay | Admitting: Family Medicine

## 2016-01-30 ENCOUNTER — Other Ambulatory Visit: Payer: Self-pay | Admitting: Family Medicine

## 2016-01-30 DIAGNOSIS — K449 Diaphragmatic hernia without obstruction or gangrene: Secondary | ICD-10-CM | POA: Insufficient documentation

## 2016-02-05 ENCOUNTER — Telehealth: Payer: Self-pay | Admitting: *Deleted

## 2016-02-05 NOTE — Telephone Encounter (Signed)
[facility] forms completed,faxed,scanned, confirmation received.Sandra Tucker

## 2016-02-13 DIAGNOSIS — H43813 Vitreous degeneration, bilateral: Secondary | ICD-10-CM | POA: Diagnosis not present

## 2016-02-13 DIAGNOSIS — H353221 Exudative age-related macular degeneration, left eye, with active choroidal neovascularization: Secondary | ICD-10-CM | POA: Diagnosis not present

## 2016-02-13 DIAGNOSIS — H353213 Exudative age-related macular degeneration, right eye, with inactive scar: Secondary | ICD-10-CM | POA: Diagnosis not present

## 2016-02-13 DIAGNOSIS — H35423 Microcystoid degeneration of retina, bilateral: Secondary | ICD-10-CM | POA: Diagnosis not present

## 2016-02-18 DIAGNOSIS — Z111 Encounter for screening for respiratory tuberculosis: Secondary | ICD-10-CM | POA: Diagnosis not present

## 2016-03-04 DIAGNOSIS — H409 Unspecified glaucoma: Secondary | ICD-10-CM | POA: Diagnosis not present

## 2016-03-04 DIAGNOSIS — K449 Diaphragmatic hernia without obstruction or gangrene: Secondary | ICD-10-CM | POA: Diagnosis not present

## 2016-03-04 DIAGNOSIS — H3530 Unspecified macular degeneration: Secondary | ICD-10-CM | POA: Diagnosis not present

## 2016-03-04 DIAGNOSIS — G301 Alzheimer's disease with late onset: Secondary | ICD-10-CM | POA: Diagnosis not present

## 2016-03-04 DIAGNOSIS — M810 Age-related osteoporosis without current pathological fracture: Secondary | ICD-10-CM | POA: Diagnosis not present

## 2016-03-04 DIAGNOSIS — F39 Unspecified mood [affective] disorder: Secondary | ICD-10-CM | POA: Diagnosis not present

## 2016-03-04 DIAGNOSIS — E785 Hyperlipidemia, unspecified: Secondary | ICD-10-CM | POA: Diagnosis not present

## 2016-03-05 DIAGNOSIS — I10 Essential (primary) hypertension: Secondary | ICD-10-CM | POA: Diagnosis not present

## 2016-03-05 DIAGNOSIS — R6889 Other general symptoms and signs: Secondary | ICD-10-CM | POA: Diagnosis not present

## 2016-03-11 DIAGNOSIS — I10 Essential (primary) hypertension: Secondary | ICD-10-CM | POA: Diagnosis not present

## 2016-03-11 DIAGNOSIS — D649 Anemia, unspecified: Secondary | ICD-10-CM | POA: Diagnosis not present

## 2016-03-11 DIAGNOSIS — E559 Vitamin D deficiency, unspecified: Secondary | ICD-10-CM | POA: Diagnosis not present

## 2016-03-11 DIAGNOSIS — E538 Deficiency of other specified B group vitamins: Secondary | ICD-10-CM | POA: Diagnosis not present

## 2016-04-02 ENCOUNTER — Other Ambulatory Visit: Payer: Self-pay | Admitting: Family Medicine

## 2016-04-02 DIAGNOSIS — N63 Unspecified lump in unspecified breast: Secondary | ICD-10-CM

## 2016-04-18 ENCOUNTER — Encounter: Payer: Self-pay | Admitting: Family Medicine

## 2016-04-18 DIAGNOSIS — H353231 Exudative age-related macular degeneration, bilateral, with active choroidal neovascularization: Secondary | ICD-10-CM | POA: Diagnosis not present

## 2016-04-18 DIAGNOSIS — H353213 Exudative age-related macular degeneration, right eye, with inactive scar: Secondary | ICD-10-CM | POA: Diagnosis not present

## 2016-04-18 DIAGNOSIS — H43813 Vitreous degeneration, bilateral: Secondary | ICD-10-CM | POA: Diagnosis not present

## 2016-04-18 DIAGNOSIS — H35423 Microcystoid degeneration of retina, bilateral: Secondary | ICD-10-CM | POA: Diagnosis not present

## 2016-04-22 DIAGNOSIS — H409 Unspecified glaucoma: Secondary | ICD-10-CM | POA: Diagnosis not present

## 2016-04-22 DIAGNOSIS — M810 Age-related osteoporosis without current pathological fracture: Secondary | ICD-10-CM | POA: Diagnosis not present

## 2016-04-22 DIAGNOSIS — G309 Alzheimer's disease, unspecified: Secondary | ICD-10-CM | POA: Diagnosis not present

## 2016-04-22 DIAGNOSIS — J309 Allergic rhinitis, unspecified: Secondary | ICD-10-CM | POA: Diagnosis not present

## 2016-04-22 DIAGNOSIS — G301 Alzheimer's disease with late onset: Secondary | ICD-10-CM | POA: Diagnosis not present

## 2016-06-02 ENCOUNTER — Ambulatory Visit: Payer: PPO | Admitting: Family Medicine

## 2016-06-02 ENCOUNTER — Emergency Department (HOSPITAL_BASED_OUTPATIENT_CLINIC_OR_DEPARTMENT_OTHER)
Admission: EM | Admit: 2016-06-02 | Discharge: 2016-06-02 | Disposition: A | Discharge status: Home / Self Care | Payer: PPO | Attending: Emergency Medicine | Admitting: Emergency Medicine

## 2016-06-02 ENCOUNTER — Encounter (HOSPITAL_BASED_OUTPATIENT_CLINIC_OR_DEPARTMENT_OTHER): Payer: Self-pay | Admitting: Emergency Medicine

## 2016-06-02 ENCOUNTER — Emergency Department (HOSPITAL_BASED_OUTPATIENT_CLINIC_OR_DEPARTMENT_OTHER): Payer: PPO

## 2016-06-02 DIAGNOSIS — Z87891 Personal history of nicotine dependence: Secondary | ICD-10-CM | POA: Insufficient documentation

## 2016-06-02 DIAGNOSIS — G308 Other Alzheimer's disease: Secondary | ICD-10-CM | POA: Diagnosis not present

## 2016-06-02 DIAGNOSIS — M25552 Pain in left hip: Secondary | ICD-10-CM | POA: Diagnosis not present

## 2016-06-02 DIAGNOSIS — M1612 Unilateral primary osteoarthritis, left hip: Secondary | ICD-10-CM | POA: Diagnosis not present

## 2016-06-02 DIAGNOSIS — W19XXXA Unspecified fall, initial encounter: Secondary | ICD-10-CM

## 2016-06-02 DIAGNOSIS — F0280 Dementia in other diseases classified elsewhere without behavioral disturbance: Secondary | ICD-10-CM | POA: Insufficient documentation

## 2016-06-02 DIAGNOSIS — R0602 Shortness of breath: Secondary | ICD-10-CM | POA: Diagnosis not present

## 2016-06-02 HISTORY — DX: Unspecified osteoarthritis, unspecified site: M19.90

## 2016-06-02 HISTORY — DX: Hyperlipidemia, unspecified: E78.5

## 2016-06-02 HISTORY — DX: Alzheimer's disease, unspecified: G30.9

## 2016-06-02 HISTORY — DX: Unspecified macular degeneration: H35.30

## 2016-06-02 HISTORY — DX: Diaphragmatic hernia without obstruction or gangrene: K44.9

## 2016-06-02 HISTORY — DX: Dementia in other diseases classified elsewhere, unspecified severity, without behavioral disturbance, psychotic disturbance, mood disturbance, and anxiety: F02.80

## 2016-06-02 LAB — COMPREHENSIVE METABOLIC PANEL
ALK PHOS: 64 U/L (ref 38–126)
ALT: 10 U/L — AB (ref 14–54)
AST: 22 U/L (ref 15–41)
Albumin: 3.8 g/dL (ref 3.5–5.0)
Anion gap: 7 (ref 5–15)
BILIRUBIN TOTAL: 1.1 mg/dL (ref 0.3–1.2)
BUN: 11 mg/dL (ref 6–20)
CALCIUM: 9.4 mg/dL (ref 8.9–10.3)
CO2: 26 mmol/L (ref 22–32)
CREATININE: 0.67 mg/dL (ref 0.44–1.00)
Chloride: 103 mmol/L (ref 101–111)
Glucose, Bld: 118 mg/dL — ABNORMAL HIGH (ref 65–99)
Potassium: 3.5 mmol/L (ref 3.5–5.1)
Sodium: 136 mmol/L (ref 135–145)
Total Protein: 7.2 g/dL (ref 6.5–8.1)

## 2016-06-02 LAB — CBC WITH DIFFERENTIAL/PLATELET
Basophils Absolute: 0 10*3/uL (ref 0.0–0.1)
Basophils Relative: 0 %
EOS PCT: 1 %
Eosinophils Absolute: 0 10*3/uL (ref 0.0–0.7)
HEMATOCRIT: 44 % (ref 36.0–46.0)
HEMOGLOBIN: 15.1 g/dL — AB (ref 12.0–15.0)
LYMPHS ABS: 2.3 10*3/uL (ref 0.7–4.0)
LYMPHS PCT: 36 %
MCH: 31.1 pg (ref 26.0–34.0)
MCHC: 34.3 g/dL (ref 30.0–36.0)
MCV: 90.7 fL (ref 78.0–100.0)
Monocytes Absolute: 0.6 10*3/uL (ref 0.1–1.0)
Monocytes Relative: 9 %
NEUTROS ABS: 3.6 10*3/uL (ref 1.7–7.7)
NEUTROS PCT: 54 %
Platelets: 212 10*3/uL (ref 150–400)
RBC: 4.85 MIL/uL (ref 3.87–5.11)
RDW: 13.5 % (ref 11.5–15.5)
WBC: 6.5 10*3/uL (ref 4.0–10.5)

## 2016-06-02 MED ORDER — FENTANYL CITRATE (PF) 100 MCG/2ML IJ SOLN
25.0000 ug | Freq: Once | INTRAMUSCULAR | Status: AC
Start: 2016-06-02 — End: 2016-06-02
  Administered 2016-06-02: 25 ug via INTRAVENOUS
  Filled 2016-06-02: qty 2

## 2016-06-02 MED ORDER — TRAMADOL HCL 50 MG PO TABS: 50.0000 mg | ORAL_TABLET | Freq: Four times a day (QID) | ORAL | 0 refills | Status: DC | PRN

## 2016-06-02 NOTE — ED Notes (Signed)
ED Provider at bedside. 

## 2016-06-02 NOTE — Progress Notes (Deleted)
Subjective:    CC:   HPI:  Dementia -   Left breast nodule -   Past medical history, Surgical history, Family history not pertinant except as noted below, Social history, Allergies, and medications have been entered into the medical record, reviewed, and corrections made.   Review of Systems: No fevers, chills, night sweats, weight loss, chest pain, or shortness of breath.   Objective:    General: Well Developed, well nourished, and in no acute distress.  Neuro: Alert and oriented x3, extra-ocular muscles intact, sensation grossly intact.  HEENT: Normocephalic, atraumatic  Skin: Warm and dry, no rashes. Cardiac: Regular rate and rhythm, no murmurs rubs or gallops, no lower extremity edema.  Respiratory: Clear to auscultation bilaterally. Not using accessory muscles, speaking in full sentences.   Impression and Recommendations:   Dementia -

## 2016-06-02 NOTE — ED Notes (Signed)
Patient transported to CT 

## 2016-06-02 NOTE — ED Notes (Signed)
Patient transported to X-ray 

## 2016-06-02 NOTE — ED Provider Notes (Signed)
[facility] Note   CSN: [id number] Arrival date & time: 06/02/16  1807  By signing my name below, I, Teofilo PodMatthew P. Jamison, attest that this documentation has been prepared under the direction and in the presence of Alvira MondayErin Taylore Hinde, MD . Electronically Signed: Teofilo PodMatthew P. Jamison, ED Scribe. 06/02/2016. 6:34 PM.    History   Chief Complaint Chief Complaint  Patient presents with  . Hip Pain    The history is provided by the patient and a relative. No language interpreter was used.   HPI Comments:  Sandra Tucker is a 82 y.o. female with PMHx of Alzheimer's who presents to the Emergency Department complaining of constant left hip pain since yesterday. Pt's stepson states that her short term memory is very limited at baseline. Pt states that she is unsure of how her hip began to hurt, and she is not sure if she fell. Doreene AdasStepson states that pt has been walking on it however complaining of pain, and that the staff at the pt's were unsure of any fall. No alleviating factors noted. Pt denies other associated symptoms.  Local 5 caveat Dementia.   Past Medical History:  Diagnosis Date  . Alzheimer's dementia   . Hernia, diaphragmatic, without obstruction   . Hyperlipemia   . Macular degeneration syndrome   . Osteoarthritis     Patient Active Problem List   Diagnosis Date Noted  . Hiatal hernia 01/30/2016  . Osteoporosis, unspecified 05/19/2013  . Macular degeneration 07/12/2012  . HYPERLIPIDEMIA 05/16/2010  . MOOD DISORDER 05/16/2010  . Alzheimer's disease 05/16/2010    Past Surgical History:  Procedure Laterality Date  . ABDOMINAL HYSTERECTOMY      OB History    No data available       Home Medications    Prior to Admission medications   Medication Sig Start Date End Date Taking? Authorizing Provider  azelastine (ASTELIN) 0.1 % nasal spray Place 2 sprays into both nostrils daily. Use in each nostril as directed 07/20/15   Agapito Gamesatherine D Metheney, MD    donepezil (ARICEPT) 10 MG tablet Take 1 tablet (10 mg total) by mouth at bedtime. 07/31/15   Agapito Gamesatherine D Metheney, MD  traMADol (ULTRAM) 50 MG tablet Take 1 tablet (50 mg total) by mouth every 6 (six) hours as needed. 06/02/16   Alvira MondayErin Arletha Marschke, MD  travoprost, benzalkonium, (TRAVATAN) 0.004 % ophthalmic solution Place 1 drop into both eyes at bedtime.      Historical Provider, MD    Family History Family History  Problem Relation Age of Onset  . Hyperlipidemia Son   . Hypertension Son   . Heart attack    . Dementia      Social History Social History  Substance Use Topics  . Smoking status: Former Smoker    Types: Cigarettes    Quit date: 07/07/2000  . Smokeless tobacco: Never Used  . Alcohol use No     Allergies   Namenda [memantine hcl]   Review of Systems Review of Systems  Unable to perform ROS: Dementia  Constitutional: Negative for fever.  Musculoskeletal: Positive for arthralgias.  All other systems reviewed and are negative.    Physical Exam Updated Vital Signs BP 143/86 (BP Location: Right Arm)   Pulse 87   Temp 97.8 F (36.6 C) (Oral)   Resp 18   Ht 5\' 8"  (1.727 m)   Wt 160 lb (72.6 kg)   SpO2 90%   BMI 24.33 kg/m   Physical Exam  Constitutional: She appears  well-developed and well-nourished. No distress.  HENT:  Head: Normocephalic and atraumatic.  Eyes: Conjunctivae and EOM are normal.  Neck: Normal range of motion. Neck supple. No spinous process tenderness present.  Cardiovascular: Normal rate, regular rhythm, normal heart sounds and intact distal pulses.  Exam reveals no gallop and no friction rub.   No murmur heard. Pulmonary/Chest: Effort normal and breath sounds normal. No respiratory distress. She has no wheezes. She has no rales.  Abdominal: Soft. She exhibits no distension. There is no tenderness. There is no guarding.  Musculoskeletal: She exhibits no edema or tenderness.  Spontaneously bends and extends hip and knees  bilaterally Normal pulses   Neurological: She is alert.  Skin: Skin is warm and dry. No rash noted. She is not diaphoretic. No erythema.  Psychiatric: She has a normal mood and affect.  Nursing note and vitals reviewed.    ED Treatments / Results  DIAGNOSTIC STUDIES:  Oxygen Saturation is 94% on RA, normal by my interpretation.    COORDINATION OF CARE:  6:32 PM Discussed treatment plan with pt at bedside and pt agreed to plan.   Labs (all labs ordered are listed, but only abnormal results are displayed) Labs Reviewed  CBC WITH DIFFERENTIAL/PLATELET - Abnormal; Notable for the following:       Result Value   Hemoglobin 15.1 (*)    All other components within normal limits  COMPREHENSIVE METABOLIC PANEL - Abnormal; Notable for the following:    Glucose, Bld 118 (*)    ALT 10 (*)    All other components within normal limits    EKG  EKG Interpretation None       Radiology Dg Chest 1 View  Result Date: 06/02/2016 CLINICAL DATA:  Preop chest x-ray increased shortness of breath EXAM: CHEST 1 VIEW COMPARISON:  None. FINDINGS: Linear atelectasis or scarring at the left lung base. Large lucent structure over the heart could relate to a large hiatal hernia. Possible tiny left pleural effusion. Mild cardiomegaly. Mediastinum appears widened, which may be secondary to aortic tortuosity and dilated SVC. No pneumothorax. IMPRESSION: 1. Large gas containing structure over the heart suspected to represent a large hiatal hernia. 2. Widened mediastinum, could be attributable to tortuous aorta, rotation and enlarged SVC, however other mediastinal pathology cannot be excluded. CT chest recommended for further evaluation. 3. Left basilar atelectasis or scar. Electronically Signed   By: Jasmine PangKim  Fujinaga M.D.   On: 06/02/2016 18:59   Ct Hip Left Wo Contrast  Result Date: 06/02/2016 CLINICAL DATA:  Left hip pain EXAM: CT OF THE LEFT HIP WITHOUT CONTRAST TECHNIQUE: Multidetector CT imaging of the  left hip was performed according to the standard protocol. Multiplanar CT image reconstructions were also generated. COMPARISON:  Radiograph 06/02/2016 FINDINGS: Bones/Joint/Cartilage No acute displaced fracture or dislocation is visualized. Moderate severe narrowing of the inferior joint space with sclerosis and subarticular cyst formation. Moderate narrowing of the superior joint space compartment. Osteophytosis of the femoral head neck junction. Small anterior acetabular osteophytes. Left pubic rami appear intact. Imaged portions of the sacrum show no fracture. Small metallic anchors in the bilateral pubic bones. Pubic symphysis degenerative changes. SI joint disease with gas in the SI joints and sclerosis. Ligaments Suboptimally assessed by CT. Muscles and Tendons Mild fatty atrophy of the muscles surrounding the left hip. Soft tissues Atherosclerosis of the left iliac vessels. Sigmoid colon diverticular disease. Moderate to marked air distention of the rectum with small stool. IMPRESSION: 1. No acute displaced fracture or dislocation. 2. Moderate  severe arthritic changes of the left hip most notable involving the inferior joint space. 3. Sigmoid colon diverticular disease but without inflammation. 4. Air distention of the rectum. Electronically Signed   By: Jasmine Pang M.D.   On: 06/02/2016 21:08   Dg Hip Unilat With Pelvis 2-3 Views Left  Result Date: 06/02/2016 CLINICAL DATA:  Complaining of left hip pain since yesterday EXAM: DG HIP (WITH OR WITHOUT PELVIS) 2-3V LEFT COMPARISON:  01/01/2014 FINDINGS: Lumbosacral stabilization rods and fixating screws. Curvilinear opacities are seen lateral to this. Small metallic densities project over the bilateral pubic symphysis. No acute fracture or dislocation on the left. There are mild degenerative changes. Lucency at the right femoral head neck junction appears similar to 2015 radiographs at which time this was felt to be related to patient positioning.  IMPRESSION: 1. Mild degenerative changes of the left hip. No definite acute fracture or dislocation on the left. Electronically Signed   By: Jasmine Pang M.D.   On: 06/02/2016 19:10    Procedures Procedures (including critical care time)  Medications Ordered in ED Medications  fentaNYL (SUBLIMAZE) injection 25 mcg (25 mcg Intravenous Given 06/02/16 1946)     Initial Impression / Assessment and Plan / ED Course  I have reviewed the triage vital signs and the nursing notes.  Pertinent labs & imaging results that were available during my care of the patient were reviewed by me and considered in my medical decision making (see chart for details).  Clinical Course    82 year old female with a history of dementia presents with concern for left hip pain and finding of possible fracture on x-ray. Unable to see images from x-ray done at the skilled nursing facility, an x-ray was repeated here showing degenerative changes without signs of acute fracture. CT of the hip was then obtained which showed no evidence of fracture. XR chest obtained as possible pre-op eval, shows tortuous aorta, hiatal hernia, doubt acute intrathoracic pathology by hx, physical. Labs were obtained given initial concern for fracture and showed no acute findings. The patient's initial abdominal exam was benign and doubt acute intraabdominal etiology of pain.  Son reports that she is at her mental baseline. Patient is given prescription for tramadol for pain, and recommend close outpatient follow-up and discussed reasons to return.  Final Clinical Impressions(s) / ED Diagnoses   Final diagnoses:  Left hip pain  Primary osteoarthritis of left hip    New Prescriptions Discharge Medication List as of 06/02/2016  9:36 PM    I personally performed the services described in this documentation, which was scribed in my presence. The recorded information has been reviewed and is accurate.     Alvira Monday, MD 06/03/16  [phone]

## 2016-06-02 NOTE — ED Triage Notes (Signed)
Patient reports that she feel and having pain to her left hip. Xray shows that she has a left hip fracture

## 2016-06-02 NOTE — ED Notes (Signed)
Pt's son verbalizes understanding of dc instructions and denies any further needs at this time 

## 2016-06-03 DIAGNOSIS — H409 Unspecified glaucoma: Secondary | ICD-10-CM | POA: Diagnosis not present

## 2016-06-03 DIAGNOSIS — F39 Unspecified mood [affective] disorder: Secondary | ICD-10-CM | POA: Diagnosis not present

## 2016-06-03 DIAGNOSIS — G894 Chronic pain syndrome: Secondary | ICD-10-CM | POA: Diagnosis not present

## 2016-06-03 DIAGNOSIS — G301 Alzheimer's disease with late onset: Secondary | ICD-10-CM | POA: Diagnosis not present

## 2016-06-03 DIAGNOSIS — S7292XS Unspecified fracture of left femur, sequela: Secondary | ICD-10-CM | POA: Diagnosis not present

## 2016-06-09 ENCOUNTER — Encounter: Payer: PPO | Admitting: Sports Medicine

## 2016-06-20 DIAGNOSIS — H43813 Vitreous degeneration, bilateral: Secondary | ICD-10-CM | POA: Diagnosis not present

## 2016-06-20 DIAGNOSIS — H353231 Exudative age-related macular degeneration, bilateral, with active choroidal neovascularization: Secondary | ICD-10-CM | POA: Diagnosis not present

## 2016-06-20 DIAGNOSIS — H35423 Microcystoid degeneration of retina, bilateral: Secondary | ICD-10-CM | POA: Diagnosis not present

## 2016-06-20 DIAGNOSIS — H353213 Exudative age-related macular degeneration, right eye, with inactive scar: Secondary | ICD-10-CM | POA: Diagnosis not present

## 2016-08-26 DIAGNOSIS — H353221 Exudative age-related macular degeneration, left eye, with active choroidal neovascularization: Secondary | ICD-10-CM | POA: Diagnosis not present

## 2016-08-26 DIAGNOSIS — H353213 Exudative age-related macular degeneration, right eye, with inactive scar: Secondary | ICD-10-CM | POA: Diagnosis not present

## 2016-08-26 DIAGNOSIS — H35423 Microcystoid degeneration of retina, bilateral: Secondary | ICD-10-CM | POA: Diagnosis not present

## 2016-08-26 DIAGNOSIS — H43813 Vitreous degeneration, bilateral: Secondary | ICD-10-CM | POA: Diagnosis not present

## 2016-10-21 DIAGNOSIS — F39 Unspecified mood [affective] disorder: Secondary | ICD-10-CM | POA: Diagnosis not present

## 2016-10-21 DIAGNOSIS — J309 Allergic rhinitis, unspecified: Secondary | ICD-10-CM | POA: Diagnosis not present

## 2016-10-21 DIAGNOSIS — G894 Chronic pain syndrome: Secondary | ICD-10-CM | POA: Diagnosis not present

## 2016-10-21 DIAGNOSIS — H409 Unspecified glaucoma: Secondary | ICD-10-CM | POA: Diagnosis not present

## 2016-10-21 DIAGNOSIS — F0391 Unspecified dementia with behavioral disturbance: Secondary | ICD-10-CM | POA: Diagnosis not present

## 2016-10-21 DIAGNOSIS — G301 Alzheimer's disease with late onset: Secondary | ICD-10-CM | POA: Diagnosis not present

## 2016-10-22 ENCOUNTER — Emergency Department (HOSPITAL_COMMUNITY)
Admission: EM | Admit: 2016-10-22 | Discharge: 2016-10-22 | Disposition: A | Discharge status: Home / Self Care | Payer: PPO | Attending: Emergency Medicine | Admitting: Emergency Medicine

## 2016-10-22 ENCOUNTER — Emergency Department (HOSPITAL_COMMUNITY): Payer: PPO

## 2016-10-22 ENCOUNTER — Encounter (HOSPITAL_COMMUNITY): Payer: Self-pay

## 2016-10-22 DIAGNOSIS — M5489 Other dorsalgia: Secondary | ICD-10-CM | POA: Diagnosis not present

## 2016-10-22 DIAGNOSIS — R0789 Other chest pain: Secondary | ICD-10-CM

## 2016-10-22 DIAGNOSIS — Z87891 Personal history of nicotine dependence: Secondary | ICD-10-CM | POA: Diagnosis not present

## 2016-10-22 DIAGNOSIS — R079 Chest pain, unspecified: Secondary | ICD-10-CM | POA: Diagnosis not present

## 2016-10-22 DIAGNOSIS — R0602 Shortness of breath: Secondary | ICD-10-CM | POA: Diagnosis not present

## 2016-10-22 DIAGNOSIS — Z79899 Other long term (current) drug therapy: Secondary | ICD-10-CM | POA: Diagnosis not present

## 2016-10-22 DIAGNOSIS — R52 Pain, unspecified: Secondary | ICD-10-CM | POA: Diagnosis not present

## 2016-10-22 DIAGNOSIS — G309 Alzheimer's disease, unspecified: Secondary | ICD-10-CM | POA: Insufficient documentation

## 2016-10-22 LAB — CBC
HCT: 40.9 % (ref 36.0–46.0)
Hemoglobin: 13.8 g/dL (ref 12.0–15.0)
MCH: 30.7 pg (ref 26.0–34.0)
MCHC: 33.7 g/dL (ref 30.0–36.0)
MCV: 91.1 fL (ref 78.0–100.0)
PLATELETS: 186 10*3/uL (ref 150–400)
RBC: 4.49 MIL/uL (ref 3.87–5.11)
RDW: 13.6 % (ref 11.5–15.5)
WBC: 8.4 10*3/uL (ref 4.0–10.5)

## 2016-10-22 LAB — I-STAT TROPONIN, ED
TROPONIN I, POC: 0.04 ng/mL (ref 0.00–0.08)
TROPONIN I, POC: 0 ng/mL (ref 0.00–0.08)

## 2016-10-22 LAB — BASIC METABOLIC PANEL
Anion gap: 9 (ref 5–15)
BUN: 6 mg/dL (ref 6–20)
CO2: 27 mmol/L (ref 22–32)
CREATININE: 0.6 mg/dL (ref 0.44–1.00)
Calcium: 8.9 mg/dL (ref 8.9–10.3)
Chloride: 103 mmol/L (ref 101–111)
GFR calc Af Amer: >60 mL/min (ref >60)
Glucose, Bld: 108 mg/dL — ABNORMAL HIGH (ref 65–99)
Potassium: 3.5 mmol/L (ref 3.5–5.1)
SODIUM: 139 mmol/L (ref 135–145)

## 2016-10-22 MED ORDER — IBUPROFEN 200 MG PO TABS
200.0000 mg | ORAL_TABLET | Freq: Once | ORAL | Status: AC
  Administered 2016-10-22: 200 mg via ORAL
  Filled 2016-10-22: qty 1

## 2016-10-22 NOTE — ED Notes (Signed)
Phlebotomy at the bedside attempting Blood draw

## 2016-10-22 NOTE — ED Provider Notes (Signed)
[facility]-EMERGENCY DEPT Provider Note   CSN: [id number] Arrival date & time: 10/22/16  1805     History   Chief Complaint Chief Complaint  Patient presents with  . Chest Pain    HPI Sandra Tucker is a 83 y.o. female.  83 year old female from nursing home who presents with back pain that radiated to her chest. Patient has a history of dementia and pain was characterized as sharp and worse with positions. Patient herself states that she has sharp chest pain on her anterior chest across the precordium without radiation to her arms. She denies any dyspnea or diaphoresis. There is no reported history of cough, dyspnea, congestion. No fever or chills noted. EMS was called and patient transported here for further management.      Past Medical History:  Diagnosis Date  . Alzheimer's dementia   . Hernia, diaphragmatic, without obstruction   . Hyperlipemia   . Macular degeneration syndrome   . Osteoarthritis     Patient Active Problem List   Diagnosis Date Noted  . Hiatal hernia 01/30/2016  . Osteoporosis, unspecified 05/19/2013  . Macular degeneration 07/12/2012  . HYPERLIPIDEMIA 05/16/2010  . MOOD DISORDER 05/16/2010  . Alzheimer's disease 05/16/2010    Past Surgical History:  Procedure Laterality Date  . ABDOMINAL HYSTERECTOMY      OB History    No data available       Home Medications    Prior to Admission medications   Medication Sig Start Date End Date Taking? Authorizing Provider  azelastine (ASTELIN) 0.1 % nasal spray Place 2 sprays into both nostrils daily. Use in each nostril as directed 07/20/15   Agapito Games, MD  donepezil (ARICEPT) 10 MG tablet Take 1 tablet (10 mg total) by mouth at bedtime. 07/31/15   Agapito Games, MD  traMADol (ULTRAM) 50 MG tablet Take 1 tablet (50 mg total) by mouth every 6 (six) hours as needed. 06/02/16   Alvira Monday, MD  travoprost, benzalkonium, (TRAVATAN) 0.004 % ophthalmic solution Place 1 drop into both eyes at  bedtime.      Historical Provider, MD    Family History Family History  Problem Relation Age of Onset  . Hyperlipidemia Son   . Hypertension Son   . Heart attack    . Dementia      Social History Social History  Substance Use Topics  . Smoking status: Former Smoker    Types: Cigarettes    Quit date: 07/07/2000  . Smokeless tobacco: Never Used  . Alcohol use No     Allergies   Namenda [memantine hcl]   Review of Systems Review of Systems  Unable to perform ROS: Dementia     Physical Exam Updated Vital Signs BP 120/74 (BP Location: Right Arm)   Pulse 73   Temp 98.7 F (37.1 C) (Oral)   Resp 16   Ht  (1.727 m)   Wt 72.6 kg   SpO2 99%   BMI 24.33 kg/m   Physical Exam  Constitutional: She is oriented to person, place, and time. She appears well-developed and well-nourished.  Non-toxic appearance. No distress.  HENT:  Head: Normocephalic and atraumatic.  Eyes: Conjunctivae, EOM and lids are normal. Pupils are equal, round, and reactive to light.  Neck: Normal range of motion. Neck supple. No tracheal deviation present. No thyroid mass present.  Cardiovascular: Normal rate, regular rhythm and normal heart sounds.  Exam reveals no gallop.   No murmur heard. Pulmonary/Chest: Effort normal and breath sounds normal. No  stridor. No respiratory distress. She has no decreased breath sounds. She has no wheezes. She has no rhonchi. She has no rales.    Abdominal: Soft. Normal appearance and bowel sounds are normal. She exhibits no distension. There is no tenderness. There is no rebound and no CVA tenderness.  Musculoskeletal: Normal range of motion. She exhibits no edema or tenderness.  Neurological: She is alert and oriented to person, place, and time. She has normal strength. No cranial nerve deficit or sensory deficit. GCS eye subscore is 4. GCS verbal subscore is 5. GCS motor subscore is 6.  Skin: Skin is warm and dry. No abrasion and no rash noted.  Psychiatric:  She has a normal mood and affect. Her speech is normal and behavior is normal.  Nursing note and vitals reviewed.    ED Treatments / Results  Labs (all labs ordered are listed, but only abnormal results are displayed) Labs Reviewed  CBC  BASIC METABOLIC PANEL  I-STAT TROPOININ, ED    EKG  EKG Interpretation  Date/Time:  Wednesday October 22 2016 18:12:03 EDT Ventricular Rate:  73 PR Interval:    QRS Duration: 151 QT Interval:  453 QTC Calculation: 500 R Axis:   -85 Text Interpretation:  Sinus rhythm RBBB and LAFB Probable anteroseptal infarct, old No significant change since last tracing Confirmed by Freida Busman  MD, Takisha Pelle ([id number]) on 10/22/2016 6:40:58 PM       Radiology Dg Chest 2 View  Result Date: 10/22/2016 CLINICAL DATA:  Chest/back pain, mild shortness of breath EXAM: CHEST  2 VIEW COMPARISON:  06/02/2016 FINDINGS: Lungs are clear.  No pleural effusion or pneumothorax. The heart is normal in size. Moderate to large hiatal hernia. IMPRESSION: No evidence of acute cardiopulmonary disease. Electronically Signed   By: Charline Bills M.D.   On: 10/22/2016 18:50    Procedures Procedures (including critical care time)  Medications Ordered in ED Medications  ibuprofen (ADVIL,MOTRIN) tablet 200 mg (not administered)     Initial Impression / Assessment and Plan / ED Course  I have reviewed the triage vital signs and the nursing notes.  Pertinent labs & imaging results that were available during my care of the patient were reviewed by me and considered in my medical decision making (see chart for details).     Patient with negative delta troponin. Given Motrin for her likely chest wall pain. Do not think this represents PE. She is becoming slightly agitated and sundowning. Son agrees with this. Will discharge back to nursing home  Final Clinical Impressions(s) / ED Diagnoses   Final diagnoses:  None    New Prescriptions New Prescriptions   No medications on file      Lorre Nick, MD 10/22/16 2210

## 2016-10-22 NOTE — ED Triage Notes (Addendum)
Per [facility], Pt is coming from [facility] on [address]. Pt reported to have back pain that radiated through to her left chest. Pt has a hx of dementia and no longer reports any pain. Pt is ate baseline, alert and oriented to person. Vitals per EMS 134/84, 64 HR, 96% on 2 L

## 2016-10-22 NOTE — ED Notes (Signed)
Pt returned from X-ray.  

## 2016-10-22 NOTE — ED Notes (Signed)
Pt found up wondering hallway alone; pt placed back on monitor and now back in bed

## 2016-10-22 NOTE — ED Notes (Signed)
ED Provider at bedside. 

## 2016-10-22 NOTE — ED Notes (Signed)
Son in waiting room.

## 2016-10-22 NOTE — ED Notes (Signed)
Patient transported to X-ray 

## 2016-10-31 DIAGNOSIS — H353213 Exudative age-related macular degeneration, right eye, with inactive scar: Secondary | ICD-10-CM | POA: Diagnosis not present

## 2016-10-31 DIAGNOSIS — H43813 Vitreous degeneration, bilateral: Secondary | ICD-10-CM | POA: Diagnosis not present

## 2016-10-31 DIAGNOSIS — H35423 Microcystoid degeneration of retina, bilateral: Secondary | ICD-10-CM | POA: Diagnosis not present

## 2016-10-31 DIAGNOSIS — H353221 Exudative age-related macular degeneration, left eye, with active choroidal neovascularization: Secondary | ICD-10-CM | POA: Diagnosis not present

## 2016-11-20 ENCOUNTER — Emergency Department (HOSPITAL_COMMUNITY)
Admission: EM | Admit: 2016-11-20 | Discharge: 2016-11-21 | Disposition: A | Discharge status: Home / Self Care | Payer: PPO | Attending: Emergency Medicine | Admitting: Emergency Medicine

## 2016-11-20 ENCOUNTER — Emergency Department (HOSPITAL_COMMUNITY): Payer: PPO

## 2016-11-20 ENCOUNTER — Encounter (HOSPITAL_COMMUNITY): Payer: Self-pay | Admitting: Emergency Medicine

## 2016-11-20 DIAGNOSIS — R1031 Right lower quadrant pain: Secondary | ICD-10-CM | POA: Insufficient documentation

## 2016-11-20 DIAGNOSIS — K2970 Gastritis, unspecified, without bleeding: Secondary | ICD-10-CM | POA: Diagnosis not present

## 2016-11-20 DIAGNOSIS — R10817 Generalized abdominal tenderness: Secondary | ICD-10-CM | POA: Diagnosis not present

## 2016-11-20 DIAGNOSIS — R112 Nausea with vomiting, unspecified: Secondary | ICD-10-CM | POA: Diagnosis not present

## 2016-11-20 DIAGNOSIS — Z79899 Other long term (current) drug therapy: Secondary | ICD-10-CM | POA: Insufficient documentation

## 2016-11-20 DIAGNOSIS — R1110 Vomiting, unspecified: Secondary | ICD-10-CM | POA: Diagnosis not present

## 2016-11-20 DIAGNOSIS — R109 Unspecified abdominal pain: Secondary | ICD-10-CM

## 2016-11-20 DIAGNOSIS — Z87891 Personal history of nicotine dependence: Secondary | ICD-10-CM | POA: Diagnosis not present

## 2016-11-20 DIAGNOSIS — G309 Alzheimer's disease, unspecified: Secondary | ICD-10-CM | POA: Insufficient documentation

## 2016-11-20 LAB — COMPREHENSIVE METABOLIC PANEL
ALT: 13 U/L — ABNORMAL LOW (ref 14–54)
ANION GAP: 7 (ref 5–15)
AST: 29 U/L (ref 15–41)
Albumin: 3.9 g/dL (ref 3.5–5.0)
Alkaline Phosphatase: 80 U/L (ref 38–126)
BILIRUBIN TOTAL: 0.6 mg/dL (ref 0.3–1.2)
BUN: 8 mg/dL (ref 6–20)
CALCIUM: 9.1 mg/dL (ref 8.9–10.3)
CO2: 26 mmol/L (ref 22–32)
Chloride: 105 mmol/L (ref 101–111)
Creatinine, Ser: 0.63 mg/dL (ref 0.44–1.00)
GFR calc Af Amer: >60 mL/min (ref >60)
Glucose, Bld: 142 mg/dL — ABNORMAL HIGH (ref 65–99)
POTASSIUM: 3.7 mmol/L (ref 3.5–5.1)
Sodium: 138 mmol/L (ref 135–145)
TOTAL PROTEIN: 7.3 g/dL (ref 6.5–8.1)

## 2016-11-20 LAB — TROPONIN I: Troponin I: <0.03 ng/mL (ref <0.03)

## 2016-11-20 LAB — CBC WITH DIFFERENTIAL/PLATELET
Basophils Absolute: 0 10*3/uL (ref 0.0–0.1)
Basophils Relative: 1 %
Eosinophils Absolute: 0 10*3/uL (ref 0.0–0.7)
Eosinophils Relative: 0 %
HEMATOCRIT: 42.8 % (ref 36.0–46.0)
Hemoglobin: 14.6 g/dL (ref 12.0–15.0)
LYMPHS PCT: 20 %
Lymphs Abs: 0.8 10*3/uL (ref 0.7–4.0)
MCH: 31.5 pg (ref 26.0–34.0)
MCHC: 34.1 g/dL (ref 30.0–36.0)
MCV: 92.4 fL (ref 78.0–100.0)
MONO ABS: 0.2 10*3/uL (ref 0.1–1.0)
MONOS PCT: 6 %
NEUTROS ABS: 3 10*3/uL (ref 1.7–7.7)
Neutrophils Relative %: 73 %
Platelets: 188 10*3/uL (ref 150–400)
RBC: 4.63 MIL/uL (ref 3.87–5.11)
RDW: 13.4 % (ref 11.5–15.5)
WBC: 4.1 10*3/uL (ref 4.0–10.5)

## 2016-11-20 LAB — LIPASE, BLOOD: LIPASE: 32 U/L (ref 11–51)

## 2016-11-20 MED ORDER — SUCRALFATE 1 G PO TABS: 1.0000 g | ORAL_TABLET | Freq: Four times a day (QID) | ORAL | 0 refills | Status: AC

## 2016-11-20 MED ORDER — IOPAMIDOL (ISOVUE-300) INJECTION 61%
INTRAVENOUS | Status: AC
  Administered 2016-11-20: 100 mL
  Filled 2016-11-20: qty 100

## 2016-11-20 MED ORDER — FAMOTIDINE 20 MG PO TABS: 20.0000 mg | ORAL_TABLET | Freq: Two times a day (BID) | ORAL | 0 refills | Status: AC

## 2016-11-20 MED ORDER — IOPAMIDOL (ISOVUE-300) INJECTION 61%
INTRAVENOUS | Status: AC
  Filled 2016-11-20: qty 30

## 2016-11-20 MED ORDER — SODIUM CHLORIDE 0.9 % IV SOLN
INTRAVENOUS | Status: DC
  Administered 2016-11-20: 18:00:00 via INTRAVENOUS

## 2016-11-20 NOTE — ED Provider Notes (Signed)
[facility]-EMERGENCY DEPT Provider Note   CSN: [id number] Arrival date & time: 11/20/16  1619     History   Chief Complaint Chief Complaint  Patient presents with  . Abdominal Pain    HPI Sandra Tucker is a 83 y.o. female.  83 year old female with history of dementia presents from nursing home with abdominal pain mostly localized to the right lower quadrant. No reported fever, vomiting, diarrhea. History is limited due to her current medicine dementia. Patient herself denies any pain at this time. Emesis called and patient transported here.      Past Medical History:  Diagnosis Date  . Alzheimer's dementia   . Hernia, diaphragmatic, without obstruction   . Hyperlipemia   . Macular degeneration syndrome   . Osteoarthritis     Patient Active Problem List   Diagnosis Date Noted  . Hiatal hernia 01/30/2016  . Osteoporosis, unspecified 05/19/2013  . Macular degeneration 07/12/2012  . HYPERLIPIDEMIA 05/16/2010  . MOOD DISORDER 05/16/2010  . Alzheimer's disease 05/16/2010    Past Surgical History:  Procedure Laterality Date  . ABDOMINAL HYSTERECTOMY      OB History    No data available       Home Medications    Prior to Admission medications   Medication Sig Start Date End Date Taking? Authorizing Provider  azelastine (ASTELIN) 0.1 % nasal spray Place 2 sprays into both nostrils daily. Use in each nostril as directed 07/20/15   Agapito Games, MD  donepezil (ARICEPT) 10 MG tablet Take 1 tablet (10 mg total) by mouth at bedtime. 07/31/15   Agapito Games, MD  traMADol (ULTRAM) 50 MG tablet Take 1 tablet (50 mg total) by mouth every 6 (six) hours as needed. 06/02/16   Alvira Monday, MD  travoprost, benzalkonium, (TRAVATAN) 0.004 % ophthalmic solution Place 1 drop into both eyes at bedtime.      [provider]    Family History Family History  Problem Relation Age of Onset  . Hyperlipidemia Son   . Hypertension Son   . Heart attack Unknown     . Dementia Unknown     Social History Social History  Substance Use Topics  . Smoking status: Former Smoker    Types: Cigarettes    Quit date: 07/07/2000  . Smokeless tobacco: Never Used  . Alcohol use No     Allergies   Namenda [memantine hcl]   Review of Systems Review of Systems  Unable to perform ROS: Dementia     Physical Exam Updated Vital Signs BP (!) 149/100 (BP Location: Left Arm)   Pulse 87   Temp 97.9 F (36.6 C) (Oral)   Resp (!) 26   Ht 5\' 8"  (1.727 m)   Wt 160 lb (72.6 kg)   SpO2 97%   BMI 24.33 kg/m   Physical Exam  Constitutional: She appears well-developed and well-nourished.  Non-toxic appearance. No distress.  HENT:  Head: Normocephalic and atraumatic.  Eyes: Conjunctivae, EOM and lids are normal. Pupils are equal, round, and reactive to light.  Neck: Normal range of motion. Neck supple. No tracheal deviation present. No thyroid mass present.  Cardiovascular: Normal rate, regular rhythm and normal heart sounds.  Exam reveals no gallop.   No murmur heard. Pulmonary/Chest: Effort normal and breath sounds normal. No stridor. No respiratory distress. She has no decreased breath sounds. She has no wheezes. She has no rhonchi. She has no rales.  Abdominal: Soft. Normal appearance and bowel sounds are normal. She exhibits no distension. There  is no tenderness. There is no rigidity, no rebound, no guarding and no CVA tenderness.  Musculoskeletal: Normal range of motion. She exhibits no edema or tenderness.  Neurological: She is alert. She has normal strength. No cranial nerve deficit or sensory deficit. GCS eye subscore is 4. GCS verbal subscore is 5. GCS motor subscore is 6.  Skin: Skin is warm and dry. No abrasion and no rash noted.  Psychiatric: Her speech is normal and behavior is normal. Her affect is inappropriate.  Nursing note and vitals reviewed.    ED Treatments / Results  Labs (all labs ordered are listed, but only abnormal results are  displayed) Labs Reviewed  URINE CULTURE  CBC WITH DIFFERENTIAL/PLATELET  COMPREHENSIVE METABOLIC PANEL  LIPASE, BLOOD  URINALYSIS, ROUTINE W REFLEX MICROSCOPIC    EKG  EKG Interpretation  Date/Time:  Thursday Nov 20 2016 16:34:49 EDT Ventricular Rate:  88 PR Interval:    QRS Duration: 143 QT Interval:  396 QTC Calculation: 480 R Axis:   -86 Text Interpretation:  Sinus rhythm Right bundle branch block Anterolateral infarct, old No significant change since last tracing Confirmed by Lamyah Creed  MD, Steen Bisig ([id number]) on 11/20/2016 4:50:22 PM       Radiology No results found.  Procedures Procedures (including critical care time)  Medications Ordered in ED Medications  0.9 %  sodium chloride infusion (not administered)     Initial Impression / Assessment and Plan / ED Course  I have reviewed the triage vital signs and the nursing notes.  Pertinent labs & imaging results that were available during my care of the patient were reviewed by me and considered in my medical decision making (see chart for details).    Patient's labs are reassuring here. Does have incidental finding of a mass on top of her right adrenal gland. Had a long discussion with the patient's relative and they will follow with her Dr. for this in 3 months for a CT scan. Has evidence of hiatal hernia. Will place patient on PPI and Carafate and discharge back to home   Final Clinical Impressions(s) / ED Diagnoses   Final diagnoses:  None    New Prescriptions New Prescriptions   No medications on file     Lorre NickAllen, Tawney Vanorman, MD 11/20/16 2329

## 2016-11-20 NOTE — ED Notes (Signed)
Patient drinking PO contrast-tolerating well

## 2016-11-20 NOTE — ED Triage Notes (Signed)
Patient is from [facility] on [address].  Patient is having abdominal pain which radiates to her RUQ.  Patient is at baseline, alert and oriented to person.    Vitals per EMS:   BP:148/111 HR: 90 R: 20 94% on room air

## 2016-11-20 NOTE — Discharge Instructions (Signed)
You have a mass on top of her right adrenal gland which requires a repeat CT in 3 months.

## 2016-11-20 NOTE — ED Notes (Signed)
Patient aware that she needs to give urine specimen and will when able to void. Has voided earlier but was unable to obtain specimen earlier.

## 2016-11-20 NOTE — ED Notes (Signed)
Contacted facility and spoke Sandra Tucker,.  Patient is complaining of back pain, lower abdominal pain, and n/v.  Patient had some n/v today around 3:45 pm.

## 2016-11-20 NOTE — ED Notes (Signed)
Bed: [id number] Expected date:  Expected time:  Means of arrival:  Comments: EMS-RUQ pain

## 2016-11-25 DIAGNOSIS — F0631 Mood disorder due to known physiological condition with depressive features: Secondary | ICD-10-CM | POA: Diagnosis not present

## 2016-11-25 DIAGNOSIS — R109 Unspecified abdominal pain: Secondary | ICD-10-CM | POA: Diagnosis not present

## 2016-11-25 DIAGNOSIS — H4089 Other specified glaucoma: Secondary | ICD-10-CM | POA: Diagnosis not present

## 2016-11-25 DIAGNOSIS — R1110 Vomiting, unspecified: Secondary | ICD-10-CM | POA: Diagnosis not present

## 2016-11-25 DIAGNOSIS — G309 Alzheimer's disease, unspecified: Secondary | ICD-10-CM | POA: Diagnosis not present

## 2016-11-25 DIAGNOSIS — H3530 Unspecified macular degeneration: Secondary | ICD-10-CM | POA: Diagnosis not present

## 2016-11-25 DIAGNOSIS — M859 Disorder of bone density and structure, unspecified: Secondary | ICD-10-CM | POA: Diagnosis not present

## 2016-11-25 DIAGNOSIS — M545 Low back pain: Secondary | ICD-10-CM | POA: Diagnosis not present

## 2016-12-09 DIAGNOSIS — I10 Essential (primary) hypertension: Secondary | ICD-10-CM | POA: Diagnosis not present

## 2016-12-09 DIAGNOSIS — N183 Chronic kidney disease, stage 3 (moderate): Secondary | ICD-10-CM | POA: Diagnosis not present

## 2016-12-09 DIAGNOSIS — E039 Hypothyroidism, unspecified: Secondary | ICD-10-CM | POA: Diagnosis not present

## 2016-12-09 DIAGNOSIS — E538 Deficiency of other specified B group vitamins: Secondary | ICD-10-CM | POA: Diagnosis not present

## 2016-12-09 DIAGNOSIS — E559 Vitamin D deficiency, unspecified: Secondary | ICD-10-CM | POA: Diagnosis not present

## 2016-12-09 DIAGNOSIS — D529 Folate deficiency anemia, unspecified: Secondary | ICD-10-CM | POA: Diagnosis not present

## 2017-01-02 DIAGNOSIS — H35423 Microcystoid degeneration of retina, bilateral: Secondary | ICD-10-CM | POA: Diagnosis not present

## 2017-01-02 DIAGNOSIS — H353213 Exudative age-related macular degeneration, right eye, with inactive scar: Secondary | ICD-10-CM | POA: Diagnosis not present

## 2017-01-02 DIAGNOSIS — H43813 Vitreous degeneration, bilateral: Secondary | ICD-10-CM | POA: Diagnosis not present

## 2017-01-02 DIAGNOSIS — H353221 Exudative age-related macular degeneration, left eye, with active choroidal neovascularization: Secondary | ICD-10-CM | POA: Diagnosis not present

## 2017-03-13 DIAGNOSIS — H35423 Microcystoid degeneration of retina, bilateral: Secondary | ICD-10-CM | POA: Diagnosis not present

## 2017-03-13 DIAGNOSIS — H43813 Vitreous degeneration, bilateral: Secondary | ICD-10-CM | POA: Diagnosis not present

## 2017-03-13 DIAGNOSIS — H353213 Exudative age-related macular degeneration, right eye, with inactive scar: Secondary | ICD-10-CM | POA: Diagnosis not present

## 2017-03-13 DIAGNOSIS — H353221 Exudative age-related macular degeneration, left eye, with active choroidal neovascularization: Secondary | ICD-10-CM | POA: Diagnosis not present

## 2017-04-08 ENCOUNTER — Emergency Department (HOSPITAL_COMMUNITY): Payer: PPO

## 2017-04-08 ENCOUNTER — Observation Stay (HOSPITAL_COMMUNITY)
Admission: EM | Admit: 2017-04-08 | Discharge: 2017-04-10 | Disposition: A | Discharge status: Skilled Nursing Facility | Payer: PPO | Attending: Internal Medicine | Admitting: Internal Medicine

## 2017-04-08 ENCOUNTER — Encounter (HOSPITAL_COMMUNITY): Payer: Self-pay

## 2017-04-08 DIAGNOSIS — Z87891 Personal history of nicotine dependence: Secondary | ICD-10-CM | POA: Insufficient documentation

## 2017-04-08 DIAGNOSIS — H3530 Unspecified macular degeneration: Secondary | ICD-10-CM | POA: Insufficient documentation

## 2017-04-08 DIAGNOSIS — W19XXXA Unspecified fall, initial encounter: Secondary | ICD-10-CM | POA: Diagnosis present

## 2017-04-08 DIAGNOSIS — W010XXA Fall on same level from slipping, tripping and stumbling without subsequent striking against object, initial encounter: Secondary | ICD-10-CM | POA: Insufficient documentation

## 2017-04-08 DIAGNOSIS — Z79899 Other long term (current) drug therapy: Secondary | ICD-10-CM | POA: Diagnosis not present

## 2017-04-08 DIAGNOSIS — S32512A Fracture of superior rim of left pubis, initial encounter for closed fracture: Secondary | ICD-10-CM | POA: Diagnosis not present

## 2017-04-08 DIAGNOSIS — H409 Unspecified glaucoma: Secondary | ICD-10-CM | POA: Insufficient documentation

## 2017-04-08 DIAGNOSIS — R52 Pain, unspecified: Secondary | ICD-10-CM | POA: Diagnosis present

## 2017-04-08 DIAGNOSIS — R262 Difficulty in walking, not elsewhere classified: Secondary | ICD-10-CM | POA: Diagnosis not present

## 2017-04-08 DIAGNOSIS — S32599A Other specified fracture of unspecified pubis, initial encounter for closed fracture: Secondary | ICD-10-CM

## 2017-04-08 DIAGNOSIS — S32592A Other specified fracture of left pubis, initial encounter for closed fracture: Secondary | ICD-10-CM | POA: Diagnosis not present

## 2017-04-08 DIAGNOSIS — M5489 Other dorsalgia: Secondary | ICD-10-CM | POA: Diagnosis not present

## 2017-04-08 DIAGNOSIS — M25552 Pain in left hip: Secondary | ICD-10-CM | POA: Diagnosis not present

## 2017-04-08 DIAGNOSIS — Z9181 History of falling: Secondary | ICD-10-CM | POA: Diagnosis not present

## 2017-04-08 DIAGNOSIS — E785 Hyperlipidemia, unspecified: Secondary | ICD-10-CM | POA: Insufficient documentation

## 2017-04-08 DIAGNOSIS — G309 Alzheimer's disease, unspecified: Secondary | ICD-10-CM | POA: Diagnosis not present

## 2017-04-08 DIAGNOSIS — F0280 Dementia in other diseases classified elsewhere without behavioral disturbance: Secondary | ICD-10-CM | POA: Diagnosis present

## 2017-04-08 DIAGNOSIS — T148XXA Other injury of unspecified body region, initial encounter: Secondary | ICD-10-CM | POA: Diagnosis not present

## 2017-04-08 DIAGNOSIS — K219 Gastro-esophageal reflux disease without esophagitis: Secondary | ICD-10-CM | POA: Diagnosis not present

## 2017-04-08 DIAGNOSIS — M545 Low back pain: Secondary | ICD-10-CM | POA: Diagnosis not present

## 2017-04-08 NOTE — ED Notes (Signed)
Patient does not know her height or weight when asked by Clinical research associate.

## 2017-04-08 NOTE — ED Triage Notes (Signed)
She tripped while walking into an elevator at her assisted living facility. She c/o low back and left hip area discomfort, although pain in these areas is no new. She arrives in no distress.

## 2017-04-08 NOTE — ED Notes (Signed)
Patient placed on bedpan but could not urinate.

## 2017-04-08 NOTE — ED Notes (Signed)
ED Provider at bedside. 

## 2017-04-08 NOTE — ED Notes (Signed)
Warm blankets and socks given to pt for comfort

## 2017-04-09 ENCOUNTER — Encounter (HOSPITAL_COMMUNITY): Payer: Self-pay | Admitting: Emergency Medicine

## 2017-04-09 DIAGNOSIS — G308 Other Alzheimer's disease: Secondary | ICD-10-CM

## 2017-04-09 DIAGNOSIS — F0280 Dementia in other diseases classified elsewhere without behavioral disturbance: Secondary | ICD-10-CM

## 2017-04-09 DIAGNOSIS — S32592A Other specified fracture of left pubis, initial encounter for closed fracture: Secondary | ICD-10-CM | POA: Diagnosis not present

## 2017-04-09 DIAGNOSIS — S32599A Other specified fracture of unspecified pubis, initial encounter for closed fracture: Secondary | ICD-10-CM

## 2017-04-09 DIAGNOSIS — W19XXXA Unspecified fall, initial encounter: Secondary | ICD-10-CM | POA: Diagnosis present

## 2017-04-09 LAB — BASIC METABOLIC PANEL
Anion gap: 8 (ref 5–15)
Anion gap: 8 (ref 5–15)
BUN: 9 mg/dL (ref 6–20)
BUN: 9 mg/dL (ref 6–20)
CALCIUM: 8.9 mg/dL (ref 8.9–10.3)
CO2: 26 mmol/L (ref 22–32)
CO2: 25 mmol/L (ref 22–32)
CREATININE: 0.66 mg/dL (ref 0.44–1.00)
Calcium: 9.1 mg/dL (ref 8.9–10.3)
Chloride: 104 mmol/L (ref 101–111)
Chloride: 105 mmol/L (ref 101–111)
Creatinine, Ser: 0.56 mg/dL (ref 0.44–1.00)
GFR calc Af Amer: >60 mL/min (ref >60)
GFR calc Af Amer: >60 mL/min (ref >60)
GFR calc non Af Amer: >60 mL/min (ref >60)
Glucose, Bld: 117 mg/dL — ABNORMAL HIGH (ref 65–99)
Glucose, Bld: 138 mg/dL — ABNORMAL HIGH (ref 65–99)
Potassium: 3.6 mmol/L (ref 3.5–5.1)
Potassium: 3.8 mmol/L (ref 3.5–5.1)
SODIUM: 138 mmol/L (ref 135–145)
Sodium: 138 mmol/L (ref 135–145)

## 2017-04-09 LAB — CBC WITH DIFFERENTIAL/PLATELET
Basophils Absolute: 0 10*3/uL (ref 0.0–0.1)
Basophils Relative: 0 %
Eosinophils Absolute: 0 10*3/uL (ref 0.0–0.7)
Eosinophils Relative: 0 %
HCT: 40.5 % (ref 36.0–46.0)
Hemoglobin: 13.8 g/dL (ref 12.0–15.0)
Lymphocytes Relative: 26 %
Lymphs Abs: 1.9 10*3/uL (ref 0.7–4.0)
MCH: 30.7 pg (ref 26.0–34.0)
MCHC: 34.1 g/dL (ref 30.0–36.0)
MCV: 90.2 fL (ref 78.0–100.0)
Monocytes Absolute: 0.4 10*3/uL (ref 0.1–1.0)
Monocytes Relative: 6 %
Neutro Abs: 4.8 10*3/uL (ref 1.7–7.7)
Neutrophils Relative %: 68 %
Platelets: 181 10*3/uL (ref 150–400)
RBC: 4.49 MIL/uL (ref 3.87–5.11)
RDW: 13.5 % (ref 11.5–15.5)
WBC: 7.1 10*3/uL (ref 4.0–10.5)

## 2017-04-09 LAB — CBC
HCT: 38.9 % (ref 36.0–46.0)
Hemoglobin: 13.3 g/dL (ref 12.0–15.0)
MCH: 30.9 pg (ref 26.0–34.0)
MCHC: 34.2 g/dL (ref 30.0–36.0)
MCV: 90.3 fL (ref 78.0–100.0)
PLATELETS: 172 10*3/uL (ref 150–400)
RBC: 4.31 MIL/uL (ref 3.87–5.11)
RDW: 13.5 % (ref 11.5–15.5)
WBC: 6.1 10*3/uL (ref 4.0–10.5)

## 2017-04-09 LAB — MRSA PCR SCREENING: MRSA by PCR: NEGATIVE

## 2017-04-09 MED ORDER — VITAMIN D3 25 MCG (1000 UNIT) PO TABS
1000.0000 [IU] | ORAL_TABLET | Freq: Every day | ORAL | Status: DC
  Administered 2017-04-09 – 2017-04-10 (×2): 1000 [IU] via ORAL
  Filled 2017-04-09 (×2): qty 1

## 2017-04-09 MED ORDER — DONEPEZIL HCL 5 MG PO TABS
5.0000 mg | ORAL_TABLET | Freq: Every day | ORAL | Status: DC
  Administered 2017-04-09: 5 mg via ORAL
  Filled 2017-04-09: qty 1

## 2017-04-09 MED ORDER — LATANOPROST 0.005 % OP SOLN
1.0000 [drp] | Freq: Every day | OPHTHALMIC | Status: DC
  Administered 2017-04-10: 1 [drp] via OPHTHALMIC
  Filled 2017-04-09: qty 2.5

## 2017-04-09 MED ORDER — SUCRALFATE 1 G PO TABS
1.0000 g | ORAL_TABLET | Freq: Four times a day (QID) | ORAL | Status: DC
  Administered 2017-04-09 – 2017-04-10 (×7): 1 g via ORAL
  Filled 2017-04-09 (×7): qty 1

## 2017-04-09 MED ORDER — ONDANSETRON HCL 4 MG/2ML IJ SOLN: 4.0000 mg | Freq: Four times a day (QID) | INTRAMUSCULAR | Status: DC | PRN

## 2017-04-09 MED ORDER — ACETAMINOPHEN 650 MG RE SUPP: 650.0000 mg | Freq: Four times a day (QID) | RECTAL | Status: DC | PRN

## 2017-04-09 MED ORDER — FAMOTIDINE 20 MG PO TABS
20.0000 mg | ORAL_TABLET | Freq: Two times a day (BID) | ORAL | Status: DC
  Administered 2017-04-09 – 2017-04-10 (×3): 20 mg via ORAL
  Filled 2017-04-09 (×3): qty 1

## 2017-04-09 MED ORDER — ACETAMINOPHEN 325 MG PO TABS
650.0000 mg | ORAL_TABLET | Freq: Four times a day (QID) | ORAL | Status: DC | PRN
  Administered 2017-04-09 – 2017-04-10 (×2): 650 mg via ORAL
  Filled 2017-04-09 (×2): qty 2

## 2017-04-09 MED ORDER — POLYVINYL ALCOHOL 1.4 % OP SOLN: 1.0000 [drp] | OPHTHALMIC | Status: DC | PRN

## 2017-04-09 MED ORDER — ONDANSETRON HCL 4 MG PO TABS: 4.0000 mg | ORAL_TABLET | Freq: Four times a day (QID) | ORAL | Status: DC | PRN

## 2017-04-09 MED ORDER — HYDROCODONE-ACETAMINOPHEN 5-325 MG PO TABS
1.0000 | ORAL_TABLET | Freq: Once | ORAL | Status: AC
  Administered 2017-04-09: 1 via ORAL
  Filled 2017-04-09: qty 1

## 2017-04-09 MED ORDER — TRAMADOL HCL 50 MG PO TABS
50.0000 mg | ORAL_TABLET | Freq: Four times a day (QID) | ORAL | Status: DC | PRN
  Administered 2017-04-09 – 2017-04-10 (×2): 50 mg via ORAL
  Filled 2017-04-09 (×2): qty 1

## 2017-04-09 NOTE — H&P (Signed)
History and Physical    Mercy Leppla XBJ:[id number] DOB: May 20, 1923 DOA: 04/08/2017  PCP: Agapito Games, MD  Patient coming from: Nursing home.  Chief Complaint: Fall.  HPI: Sandra Tucker is a 83 y.o. female with a history of dementia was brought to the ER after patient had a witnessed fall. As per the ER physician patient's fall was witnessed and patient did not hit her head. Since patient had persistent pain patient was brought to the ER.   ED Course: CT of the hip shows left pubic rami fracture with hematoma. ER physician discussed with the nursing home until at this time they're reluctant to take her back, she may need rehabilitation. Patient is being admitted for pain relief and further physical therapy recommendations.  Review of Systems: As per HPI, rest all negative.   Past Medical History:  Diagnosis Date  . Alzheimer's dementia   . Hernia, diaphragmatic, without obstruction   . Hyperlipemia   . Macular degeneration syndrome   . Osteoarthritis     Past Surgical History:  Procedure Laterality Date  . ABDOMINAL HYSTERECTOMY       reports that she quit smoking about 16 years ago. Her smoking use included Cigarettes. She has never used smokeless tobacco. She reports that she does not drink alcohol or use drugs.  Allergies  Allergen Reactions  . Namenda [Memantine Hcl] Other (See Comments)    Diarrhea, stool incontinence.     Family History  Problem Relation Age of Onset  . Hyperlipidemia Son   . Hypertension Son   . Heart attack Unknown   . Dementia Unknown     Prior to Admission medications   Medication Sig Start Date End Date Taking? Authorizing Provider  Carboxymethylcellulose Sod PF (REFRESH PLUS) 0.5 % SOLN Apply 1 drop to eye every 4 (four) hours as needed (dry eyes). Not to exceed 6 drops/24 hours   Yes [provider]  cholecalciferol (VITAMIN D) 1000 units tablet Take 1,000 Units by mouth daily.   Yes [provider]  donepezil  (ARICEPT) 5 MG tablet Take 5 mg by mouth at bedtime. 10/20/16  Yes [provider]  famotidine (PEPCID) 20 MG tablet Take 1 tablet (20 mg total) by mouth 2 (two) times daily. 11/20/16  Yes Lorre Nick, MD  latanoprost (XALATAN) 0.005 % ophthalmic solution Place 1 drop into both eyes at bedtime. 11/09/16  Yes [provider]  sucralfate (CARAFATE) 1 g tablet Take 1 tablet (1 g total) by mouth 4 (four) times daily. 11/20/16  Yes Lorre Nick, MD  traMADol (ULTRAM) 50 MG tablet Take 1 tablet (50 mg total) by mouth every 6 (six) hours as needed. 06/02/16  Yes Alvira Monday, MD  azelastine (ASTELIN) 0.1 % nasal spray Place 2 sprays into both nostrils daily. Use in each nostril as directed Patient not taking: Reported on 04/08/2017 07/20/15   Agapito Games, MD  donepezil (ARICEPT) 10 MG tablet Take 1 tablet (10 mg total) by mouth at bedtime. Patient not taking: Reported on 11/20/2016 07/31/15   Agapito Games, MD    Physical Exam: Vitals:   04/08/17 2155 04/09/17 0059 04/09/17 0421 04/09/17 0451  BP: (!) 144/77 133/79 115/68 113/61  Pulse: 66 79 85 75  Resp: Temp:    97.6 F (36.4 C)  TempSrc:    Oral  SpO2: 94% 93% 92% 95%      Constitutional: Moderately built and nourished. Vitals:   04/08/17 2155 04/09/17 0059 04/09/17 0421  04/09/17 0451  BP: (!) 144/77 133/79 115/68 113/61  Pulse: 66 79 85 75  Resp: Temp:    97.6 F (36.4 C)  TempSrc:    Oral  SpO2: 94% 93% 92% 95%   Eyes: Anicteric. No pallor. ENMT: No discharge from the ears eyes nose and mouth. Neck: No mass felt. No neck rigidity. Respiratory: No rhonchi or crepitations. Cardiovascular: S1 and S2 heard no murmurs appreciated. Abdomen: Soft nontender bowel sounds present. Musculoskeletal: Pain on moving the left hip. Skin: No rash. Neurologic: Alert awake oriented to her name. Moves all extremities. Psychiatric: Has dementia.   Labs on Admission: I have  personally reviewed following labs and imaging studies  CBC:  Recent Labs Lab 04/09/17 0053  WBC 7.1  NEUTROABS 4.8  HGB 13.8  HCT 40.5  MCV 90.2  PLT 181   Basic Metabolic Panel:  Recent Labs Lab 04/09/17 0053  NA 138  K 3.6  CL 104  CO2 26  GLUCOSE 117*  BUN 9  CREATININE 0.56  CALCIUM 9.1   GFR: CrCl cannot be calculated (Unknown ideal weight.). Liver Function Tests: No results for input(s): AST, ALT, ALKPHOS, BILITOT, PROT, ALBUMIN in the last 168 hours. No results for input(s): LIPASE, AMYLASE in the last 168 hours. No results for input(s): AMMONIA in the last 168 hours. Coagulation Profile: No results for input(s): INR, PROTIME in the last 168 hours. Cardiac Enzymes: No results for input(s): CKTOTAL, CKMB, CKMBINDEX, TROPONINI in the last 168 hours. BNP (last 3 results) No results for input(s): PROBNP in the last 8760 hours. HbA1C: No results for input(s): HGBA1C in the last 72 hours. CBG: No results for input(s): GLUCAP in the last 168 hours. Lipid Profile: No results for input(s): CHOL, HDL, LDLCALC, TRIG, CHOLHDL, LDLDIRECT in the last 72 hours. Thyroid Function Tests: No results for input(s): TSH, T4TOTAL, FREET4, T3FREE, THYROIDAB in the last 72 hours. Anemia Panel: No results for input(s): VITAMINB12, FOLATE, FERRITIN, TIBC, IRON, RETICCTPCT in the last 72 hours. Urine analysis: No results found for: COLORURINE, APPEARANCEUR, LABSPEC, PHURINE, GLUCOSEU, HGBUR, BILIRUBINUR, KETONESUR, PROTEINUR, UROBILINOGEN, NITRITE, LEUKOCYTESUR Sepsis Labs: (procalcitonin:4,lacticidven:4) )No results found for this or any previous visit (from the past 240 hour(s)).   Radiological Exams on Admission: Dg Lumbar Spine Complete  Result Date: 04/08/2017 CLINICAL DATA:  Fall with hip pain and low back pain. EXAM: LUMBAR SPINE - COMPLETE 4+ VIEW COMPARISON:  CT abdomen pelvis 11/20/2016 FINDINGS: L4-S1 posterior spinal fusion with transpedicular screws at L4  and S1. There is right convex lumbar scoliosis. No listhesis in the AP direction. No abnormal lucency surrounding the hardware. No acute fracture. The disc spaces are preserved. There is osteopenia of the L5 vertebral body and its cortex is difficult to see well. IMPRESSION: No acute fracture or static subluxation of the lumbar spine. Status post L4-S1 posterior spinal fusion. Limited assessment of the L5 vertebral body due to the degree of osteopenia and overlying structures. Electronically Signed   By: Deatra Robinson M.D.   On: 04/08/2017 20:50   Ct Hip Left Wo Contrast  Result Date: 04/08/2017 CLINICAL DATA:  Trip and fall injury.  Left hip pain. EXAM: CT OF THE LEFT HIP WITHOUT CONTRAST TECHNIQUE: Multidetector CT imaging of the left hip was performed according to the standard protocol. Multiplanar CT image reconstructions were also generated. COMPARISON:  Left hip radiographs 04/08/2017. CT abdomen and pelvis 11/20/2016. CT left hip 06/02/2016 FINDINGS: Bones/Joint/Cartilage Acute nondisplaced fractures of the left superior and inferior  pubic rami. No evidence of acute fracture or dislocation of the left hip. Acetabulum appears intact. Degenerative changes in the left hip with narrowing and sclerosis of the superior and inferior joint spaces. Prominent hypertrophic changes on the femoral and acetabular sides. Subcortical cysts. Ligaments Suboptimally assessed by CT. Muscles and Tendons Mild fatty atrophy of the gluteus muscles. No intramuscular infiltration. Soft tissues Small soft tissue hematoma surrounding the superior left pubic ramus. Infiltration in the subcutaneous fat lateral to the greater trochanter consistent with contusion. Visualized bladder is unremarkable. Diverticulosis of the visualized sigmoid colon without inflammatory changes identified. IMPRESSION: 1. Acute fractures of the left superior and inferior pubic rami. 2. Degenerative changes in the left hip joint. No evidence of hip fracture.  3. Small hematoma around the inferior pubic ramus. Focal contusion lateral to the left hip. 4. Diverticulosis of the sigmoid colon. Electronically Signed   By: Burman Nieves M.D.   On: 04/08/2017 22:59   Dg Hip Unilat With Pelvis 2-3 Views Left  Result Date: 04/08/2017 CLINICAL DATA:  Tripped or walking.  Left hip pain. EXAM: DG HIP (WITH OR WITHOUT PELVIS) 2-3V LEFT COMPARISON:  06/02/2016 pelvic and left hip radiographs. FINDINGS: No pelvic fracture or diastasis. No definite left hip fracture. No left hip dislocation. Stable chronic cortical irregularity at the superior aspect of the left femoral neck near the femoral head. Mild osteoarthritis in both hip joints. No suspicious focal osseous lesions. Partially visualized surgical hardware overlying the bilateral lumbosacral junction. IMPRESSION: No fracture. No left hip dislocation. Mild bilateral hip osteoarthritis. Stable chronic cortical irregularity at the superior left femoral neck near the femoral head, characterized as chronic degenerative change on 06/02/2016 left hip CT study. Electronically Signed   By: Delbert Phenix M.D.   On: 04/08/2017 20:43    EKG: Independently reviewed. Normal sinus with IVCD.  Assessment/Plan Principal Problem:   Closed fracture of multiple pubic rami [facility]) Active Problems:   Alzheimer's disease   Fall    1. Closed fracture of the multiple pubic rami fracture on the left side with hematoma - will get physical therapy consult and pain relief medications and probably need rehabilitation placement. 2. Alzheimer's disease - no acute issues.  CODE STATUS needs to be verified with son when available.   DVT prophylaxis: SCDs. Code Status: Full code.  Family Communication: No family at the bedside.  Disposition Plan: To be determined.  Consults called: Physical therapy.  Admission status: Observation.    Eduard Clos MD [facility] Pager [phone].  If 7PM-7AM, please contact  night-coverage [email or web address] Password [email or web address]  04/09/2017, 4:56 AM

## 2017-04-09 NOTE — ED Provider Notes (Signed)
Medical screening examination/treatment/procedure(s) were conducted as a shared visit with non-physician practitioner(s) and myself.  I personally evaluated the patient during the encounter. Briefly, the patient is a 83 y.o. female with Alzheimer here for mechanical fall. Found to have pelvic fracture. Admitted for PT. Nonweightbearing.   EKG Interpretation None           Sandra Tucker, Sandra Garnet, MD 04/09/17 [phone]

## 2017-04-09 NOTE — ED Notes (Signed)
Patient placed on purewick catheter and patient used once. Patient does not want to keep purewick on because it "bothers her".

## 2017-04-09 NOTE — ED Notes (Signed)
Patient took bp cuff, EKG leads and oxygen probe off. York Spaniel "it is not needed".

## 2017-04-09 NOTE — ED Notes (Signed)
Patient removed EKG leads x2.

## 2017-04-09 NOTE — ED Provider Notes (Signed)
[facility]-EMERGENCY DEPT Provider Note   CSN: [id number] Arrival date & time: 04/08/17  1754     History   Chief Complaint Chief Complaint  Patient presents with  . Fall  . Hip Pain    HPI Sandra Tucker is a 83 y.o. female.  HPI Patient presents to the emergency department with injuries following a fall.  The patient was walking onto an elevator and tripped and fell.  This was witnessed by staff.  She is unable to give any history due to her Alzheimer's disease.  The patient is complaining of hip pain on the left Past Medical History:  Diagnosis Date  . Alzheimer's dementia   . Hernia, diaphragmatic, without obstruction   . Hyperlipemia   . Macular degeneration syndrome   . Osteoarthritis     Patient Active Problem List   Diagnosis Date Noted  . Hiatal hernia 01/30/2016  . Osteoporosis, unspecified 05/19/2013  . Macular degeneration 07/12/2012  . HYPERLIPIDEMIA 05/16/2010  . MOOD DISORDER 05/16/2010  . Alzheimer's disease 05/16/2010    Past Surgical History:  Procedure Laterality Date  . ABDOMINAL HYSTERECTOMY      OB History    No data available       Home Medications    Prior to Admission medications   Medication Sig Start Date End Date Taking? Authorizing Provider  Carboxymethylcellulose Sod PF (REFRESH PLUS) 0.5 % SOLN Apply 1 drop to eye every 4 (four) hours as needed (dry eyes). Not to exceed 6 drops/24 hours   Yes [provider]  cholecalciferol (VITAMIN D) 1000 units tablet Take 1,000 Units by mouth daily.   Yes [provider]  donepezil (ARICEPT) 5 MG tablet Take 5 mg by mouth at bedtime. 10/20/16  Yes [provider]  famotidine (PEPCID) 20 MG tablet Take 1 tablet (20 mg total) by mouth 2 (two) times daily. 11/20/16  Yes Lorre Nick, MD  latanoprost (XALATAN) 0.005 % ophthalmic solution Place 1 drop into both eyes at bedtime. 11/09/16  Yes [provider]  sucralfate (CARAFATE) 1 g tablet Take 1 tablet (1 g total) by  mouth 4 (four) times daily. 11/20/16  Yes Lorre Nick, MD  traMADol (ULTRAM) 50 MG tablet Take 1 tablet (50 mg total) by mouth every 6 (six) hours as needed. 06/02/16  Yes Alvira Monday, MD  azelastine (ASTELIN) 0.1 % nasal spray Place 2 sprays into both nostrils daily. Use in each nostril as directed Patient not taking: Reported on 04/08/2017 07/20/15   Agapito Games, MD  donepezil (ARICEPT) 10 MG tablet Take 1 tablet (10 mg total) by mouth at bedtime. Patient not taking: Reported on 11/20/2016 07/31/15   Agapito Games, MD    Family History Family History  Problem Relation Age of Onset  . Hyperlipidemia Son   . Hypertension Son   . Heart attack Unknown   . Dementia Unknown     Social History Social History  Substance Use Topics  . Smoking status: Former Smoker    Types: Cigarettes    Quit date: 07/07/2000  . Smokeless tobacco: Never Used  . Alcohol use No     Allergies   Namenda [memantine hcl]   Review of Systems Review of Systems  Level V caveat applies due to Physical Exam Updated Vital Signs BP (!) 144/77 (BP Location: Right Arm)   Pulse 66   Temp 98.7 F (37.1 C) (Oral)   Resp 20   SpO2 94%   Physical Exam  Constitutional: She is oriented to person,  place, and time. She appears well-developed and well-nourished. No distress.  HENT:  Head: Normocephalic and atraumatic.  Mouth/Throat: Oropharynx is clear and moist.  Eyes: Pupils are equal, round, and reactive to light.  Neck: Normal range of motion. Neck supple.  Cardiovascular: Normal rate, regular rhythm and normal heart sounds.  Exam reveals no gallop and no friction rub.   No murmur heard. Pulmonary/Chest: Effort normal and breath sounds normal. No respiratory distress. She has no wheezes.  Abdominal: Soft. Bowel sounds are normal. She exhibits no distension. There is no tenderness.  Musculoskeletal:       Left hip: She exhibits decreased range of motion, tenderness and bony tenderness.  She exhibits no deformity.  Neurological: She is alert and oriented to person, place, and time. She exhibits normal muscle tone. Coordination normal.  Skin: Skin is warm and dry. Capillary refill takes less than 2 seconds. No rash noted. No erythema.  Psychiatric: She has a normal mood and affect. Her behavior is normal.  Nursing note and vitals reviewed.    ED Treatments / Results  Labs (all labs ordered are listed, but only abnormal results are displayed) Labs Reviewed  BASIC METABOLIC PANEL  CBC WITH DIFFERENTIAL/PLATELET    EKG  EKG Interpretation None       Radiology Dg Lumbar Spine Complete  Result Date: 04/08/2017 CLINICAL DATA:  Fall with hip pain and low back pain. EXAM: LUMBAR SPINE - COMPLETE 4+ VIEW COMPARISON:  CT abdomen pelvis 11/20/2016 FINDINGS: L4-S1 posterior spinal fusion with transpedicular screws at L4 and S1. There is right convex lumbar scoliosis. No listhesis in the AP direction. No abnormal lucency surrounding the hardware. No acute fracture. The disc spaces are preserved. There is osteopenia of the L5 vertebral body and its cortex is difficult to see well. IMPRESSION: No acute fracture or static subluxation of the lumbar spine. Status post L4-S1 posterior spinal fusion. Limited assessment of the L5 vertebral body due to the degree of osteopenia and overlying structures. Electronically Signed   By: Deatra Robinson M.D.   On: 04/08/2017 20:50   Ct Hip Left Wo Contrast  Result Date: 04/08/2017 CLINICAL DATA:  Trip and fall injury.  Left hip pain. EXAM: CT OF THE LEFT HIP WITHOUT CONTRAST TECHNIQUE: Multidetector CT imaging of the left hip was performed according to the standard protocol. Multiplanar CT image reconstructions were also generated. COMPARISON:  Left hip radiographs 04/08/2017. CT abdomen and pelvis 11/20/2016. CT left hip 06/02/2016 FINDINGS: Bones/Joint/Cartilage Acute nondisplaced fractures of the left superior and inferior pubic rami. No evidence of  acute fracture or dislocation of the left hip. Acetabulum appears intact. Degenerative changes in the left hip with narrowing and sclerosis of the superior and inferior joint spaces. Prominent hypertrophic changes on the femoral and acetabular sides. Subcortical cysts. Ligaments Suboptimally assessed by CT. Muscles and Tendons Mild fatty atrophy of the gluteus muscles. No intramuscular infiltration. Soft tissues Small soft tissue hematoma surrounding the superior left pubic ramus. Infiltration in the subcutaneous fat lateral to the greater trochanter consistent with contusion. Visualized bladder is unremarkable. Diverticulosis of the visualized sigmoid colon without inflammatory changes identified. IMPRESSION: 1. Acute fractures of the left superior and inferior pubic rami. 2. Degenerative changes in the left hip joint. No evidence of hip fracture. 3. Small hematoma around the inferior pubic ramus. Focal contusion lateral to the left hip. 4. Diverticulosis of the sigmoid colon. Electronically Signed   By: Burman Nieves M.D.   On: 04/08/2017 22:59   Dg Hip Unilat  With Pelvis 2-3 Views Left  Result Date: 04/08/2017 CLINICAL DATA:  Tripped or walking.  Left hip pain. EXAM: DG HIP (WITH OR WITHOUT PELVIS) 2-3V LEFT COMPARISON:  06/02/2016 pelvic and left hip radiographs. FINDINGS: No pelvic fracture or diastasis. No definite left hip fracture. No left hip dislocation. Stable chronic cortical irregularity at the superior aspect of the left femoral neck near the femoral head. Mild osteoarthritis in both hip joints. No suspicious focal osseous lesions. Partially visualized surgical hardware overlying the bilateral lumbosacral junction. IMPRESSION: No fracture. No left hip dislocation. Mild bilateral hip osteoarthritis. Stable chronic cortical irregularity at the superior left femoral neck near the femoral head, characterized as chronic degenerative change on 06/02/2016 left hip CT study. Electronically Signed   By:  Delbert Phenix M.D.   On: 04/08/2017 20:43    Procedures Procedures (including critical care time)  Medications Ordered in ED Medications - No data to display   Initial Impression / Assessment and Plan / ED Course  I have reviewed the triage vital signs and the nursing notes.  Pertinent labs & imaging results that were available during my care of the patient were reviewed by me and considered in my medical decision making (see chart for details).     Patient will be admitted because the facility that she stays may not be able to offer her the care and physical therapy needed for this type of injury, a person I spoke with at the facility was unable to answer this question states that somebody will not be able to answer this until in the morning.   Final Clinical Impressions(s) / ED Diagnoses   Final diagnoses:  Pain    New Prescriptions New Prescriptions   No medications on file     Charlestine Night, Cordelia Poche 04/09/17 0042    Charlestine Night, PA-C 04/09/17 [phone]

## 2017-04-09 NOTE — Evaluation (Signed)
Physical Therapy Evaluation Patient Details Name: Sandra Tucker MRN: [id number] DOB: [birth year] Today's Date: 04/09/2017   History of Present Illness  Sandra Tucker is a 83 y.o. female with a history of dementia was brought to the ER 04/08/17  after patient had a witnessed fall. CT of the hip shows left pubic rami fracture with hematoma  Clinical Impression  The patient initially did not  Prefer to participate, " I'm good right now". With multimodal cues, patient mobilized to Bed edge, pivot transfer to [facility] then to [facility] with mod assist. Antalgic on the left leg. Pt admitted with above diagnosis. Pt currently with functional limitations due to the deficits listed below (see PT Problem List).  Pt will benefit from skilled PT to increase their independence and safety with mobility to allow discharge to the venue listed below.       Follow Up Recommendations SNF    Equipment Recommendations  None recommended by PT    Recommendations for Other Services       Precautions / Restrictions Precautions Precautions: Fall Precaution Comments: macular degenration, ? vision deficits Restrictions Weight Bearing Restrictions: Yes Other Position/Activity Restrictions: No orders are written, a note in ED indicated [facility]      Mobility  Bed Mobility Overal bed mobility: Needs Assistance Bed Mobility: Supine to Sit     Supine to sit: Min guard     General bed mobility comments:multimodal cues to prod patient to begin bed mobility, sitting at the bed edge due to patient not wanting to mobilize. Gradually , patient began to self  self assist legs and trunk  to sitting at the bed edge with min guard.  Transfers Overall transfer level: Needs assistance   Transfers: Sit to/from Stand;Stand Pivot Transfers Sit to Stand: Mod assist Stand pivot transfers: Mod assist to max.       General transfer comment:multimodal cues to initiate ,  partial stand and pivot, patient reaching over to the [facility] armrest  to self assist, then pivot to recliner with mod/max assist. Stood  with bear hug for pericare by RN  Ambulation/Gait                Stairs            Wheelchair Mobility    Modified Rankin (Stroke Patients Only)       Balance Overall balance assessment: History of Falls;Needs assistance Sitting-balance support: No upper extremity supported;Feet supported Sitting balance-Leahy Scale: Fair     Standing balance support: During functional activity;Bilateral upper extremity supported Standing balance-Leahy Scale: Poor                               Pertinent Vitals/Pain Pain Assessment: Faces Faces Pain Scale: Hurts even more Pain Location: left leg during transfer andmweigt placed on LLE Pain Descriptors / Indicators: Moaning Pain Intervention(s): Monitored during session    Home Living Family/patient expects to be discharged to:: Skilled nursing facility                 Additional Comments: Chart indicated from ALF, Reportedly fell getting onto elevator, assume patient is ambulatory    Prior Function           Comments: unsure     Hand Dominance        Extremity/Trunk Assessment   Upper Extremity Assessment Upper Extremity Assessment: Generalized weakness    Lower Extremity Assessment Lower Extremity Assessment: Generalized weakness;LLE deficits/detail LLE Deficits /  Details: patient did flex the leg in supine, very antalgic with some weight through the leg    Cervical / Trunk Assessment Cervical / Trunk Assessment: Kyphotic  Communication      Cognition Arousal/Alertness: Awake/alert Behavior During Therapy: Flat affect Overall Cognitive Status: No family/caregiver present to determine baseline cognitive functioning Area of Impairment: Orientation                 Orientation Level: Place;Time;Situation             General Comments: does follow simple commands and cues for mobility      General Comments       Exercises     Assessment/Plan    PT Assessment Patient needs continued PT services  PT Problem List Decreased strength;Decreased range of motion;Decreased activity tolerance;Decreased balance;Decreased mobility;Decreased knowledge of precautions;Decreased safety awareness;Decreased knowledge of use of DME;Decreased cognition;Pain       PT Treatment Interventions DME instruction;Gait training;Functional mobility training;Therapeutic activities;Therapeutic exercise;Patient/family education    PT Goals (Current goals can be found in the Care Plan section)  Acute Rehab PT Goals PT Goal Formulation: Patient unable to participate in goal setting Time For Goal Achievement: 04/23/17 Potential to Achieve Goals: Fair    Frequency Min 2X/week   Barriers to discharge Decreased caregiver support      Co-evaluation               AM-PAC PT "6 Clicks" Daily Activity  Outcome Measure Difficulty turning over in bed (including adjusting bedclothes, sheets and blankets)?: Unable Difficulty moving from lying on back to sitting on the side of the bed? : Unable Difficulty sitting down on and standing up from a chair with arms (e.g., wheelchair, bedside commode, etc,.)?: Unable Help needed moving to and from a bed to chair (including a wheelchair)?: Total Help needed walking in hospital room?: Total Help needed climbing 3-5 steps with a railing? : Total 6 Click Score: 6    End of Session Equipment Utilized During Treatment: Gait belt Activity Tolerance: Patient limited by pain Patient left: in chair;with call bell/phone within reach;with chair alarm set;with nursing/sitter in room Nurse Communication: Mobility status PT Visit Diagnosis: Difficulty in walking, not elsewhere classified (R26.2);Pain;History of falling (Z91.81) Pain - Right/Left: Left Pain - part of body: Leg    Time: 1610-9604 PT Time Calculation (min) (ACUTE ONLY): 20 min   Charges:   PT Evaluation $PT Eval Low  Complexity: 1 Low     PT G Codes:   PT G-Codes **NOT FOR INPATIENT CLASS** Functional Assessment Tool Used: Clinical judgement;AM-PAC 6 Clicks Basic Mobility Functional Limitation: Mobility: Walking and moving around Mobility: Walking and Moving Around Current Status (V4098): 100 percent impaired, limited or restricted Mobility: Walking and Moving Around Goal Status (J1914): At least 20 percent but less than 40 percent impaired, limited or restricted    [facility] PT [phone] }  Rada Hay 04/09/2017, 9:14 AM

## 2017-04-09 NOTE — ED Notes (Signed)
Call report to Payden Docter, RN [phone] 

## 2017-04-09 NOTE — Clinical Social Work Note (Signed)
CSW has reviewed and discussed interventions used with Social Work Tax inspector.   Stacy Gardner, LCSWA Emergency Room Clinical Social Worker   Clinical Social Work Assessment  Patient Details  Name: Sandra Tucker MRN: [id number] Date of Birth: [birth year]  Date of referral:  04/09/17               Reason for consult:  Facility Placement                Permission sought to share information with:  Magazine features editor (Patients son) Permission granted to share information::     Name::        Agency::     Relationship::     Contact Information:     Housing/Transportation Living arrangements for the past 2 months:  Independent Dealer of Information:  Adult Children Patient Interpreter Needed:  None Criminal Activity/Legal Involvement Pertinent to Current Situation/Hospitalization:  No - Comment as needed Significant Relationships:  Adult Children, Other Family Members Lives with:  Self Do you feel safe going back to the place where you live?  No Need for family participation in patient care:  Yes (Comment)  Care giving concerns:  Patients son, Sandra Tucker expressed concerns about not being aware of skilled nursing facilities and how they operate. Patients son also expressed needing assistance with choosing a placement due to being unfamiliar with what options are available.   Social Worker assessment / plan:  CSW intern spoke with patients son, Sandra Tucker via telephone about discharge planning to SNF. Patients son was apprehensive to the idea of SNF  Due to being unfamiliar with facilities, however patients son was open to what caregivers are proposing. Patients son also lives in [place] and is having difficulties with being distant from patient. Patients son was very appreciative of writers help and is open to all options that are in the best interest of patient. Patients son left his wife, Sandra Tucker's number just in case [phone])  Plan: Insert patients information into the  hub in order to receive SNF acceptance. Will follow up by checking on what SNF options are available for patient, and discuss those options with Patients son Sandra Tucker. Transportation: Non emergency ambulance.  Employment status:  Retired Database administrator PT Recommendations:  Skilled Nursing Facility Information / Referral to community resources:  Skilled Nursing Facility  Patient/Family's Response to care:  Understanding  Patient/Family's Understanding of and Emotional Response to Diagnosis, Current Treatment, and Prognosis:  Patients son was receptive to interventions, also patients son was proactive about getting the best quality care for patient. Patients son was very aware of patients functional abilities.   Emotional Assessment Appearance:    Attitude/Demeanor/Rapport:    Affect (typically observed):    Orientation:    Alcohol / Substance use:    Psych involvement (Current and /or in the community):  No (Comment)  Discharge Needs  Concerns to be addressed:  No discharge needs identified Readmission within the last 30 days:  No Current discharge risk:  None Barriers to Discharge:  No Barriers Identified   Ulis Rias, Student-Social Work 04/09/2017, 3:09 PM

## 2017-04-09 NOTE — Progress Notes (Signed)
Pt is not oriented to place and time, confused, continue to stay "I don't know".  Unable to give ABN and Observation notice.

## 2017-04-09 NOTE — Progress Notes (Signed)
Patient seen and examined. Admitted after midnight secondary to witnessed mechanical fall at SNF; work up demonstrated left pubic rami fracture with hematoma. Patient denies CP and SOB and is hemodynamically stable. Seen already by PT who discovered need for moderate assistance and patient complaint of left side pain, they recommend SNF. Please refer to H&P written by Dr. Toniann Fail for further info/details on admission.  Vassie Loll MD [phone]

## 2017-04-10 DIAGNOSIS — G309 Alzheimer's disease, unspecified: Secondary | ICD-10-CM | POA: Diagnosis not present

## 2017-04-10 DIAGNOSIS — W19XXXD Unspecified fall, subsequent encounter: Secondary | ICD-10-CM | POA: Diagnosis not present

## 2017-04-10 DIAGNOSIS — G308 Other Alzheimer's disease: Secondary | ICD-10-CM | POA: Diagnosis not present

## 2017-04-10 DIAGNOSIS — R262 Difficulty in walking, not elsewhere classified: Secondary | ICD-10-CM | POA: Diagnosis not present

## 2017-04-10 DIAGNOSIS — M6281 Muscle weakness (generalized): Secondary | ICD-10-CM | POA: Diagnosis not present

## 2017-04-10 DIAGNOSIS — S32502S Unspecified fracture of left pubis, sequela: Secondary | ICD-10-CM | POA: Diagnosis not present

## 2017-04-10 DIAGNOSIS — S3993XA Unspecified injury of pelvis, initial encounter: Secondary | ICD-10-CM | POA: Diagnosis not present

## 2017-04-10 DIAGNOSIS — K219 Gastro-esophageal reflux disease without esophagitis: Secondary | ICD-10-CM | POA: Diagnosis not present

## 2017-04-10 DIAGNOSIS — R2681 Unsteadiness on feet: Secondary | ICD-10-CM | POA: Diagnosis not present

## 2017-04-10 DIAGNOSIS — S32592S Other specified fracture of left pubis, sequela: Secondary | ICD-10-CM | POA: Diagnosis not present

## 2017-04-10 DIAGNOSIS — H3530 Unspecified macular degeneration: Secondary | ICD-10-CM | POA: Diagnosis not present

## 2017-04-10 DIAGNOSIS — S92153A Displaced avulsion fracture (chip fracture) of unspecified talus, initial encounter for closed fracture: Secondary | ICD-10-CM | POA: Diagnosis not present

## 2017-04-10 DIAGNOSIS — K449 Diaphragmatic hernia without obstruction or gangrene: Secondary | ICD-10-CM | POA: Diagnosis not present

## 2017-04-10 DIAGNOSIS — H409 Unspecified glaucoma: Secondary | ICD-10-CM | POA: Diagnosis not present

## 2017-04-10 DIAGNOSIS — M25552 Pain in left hip: Secondary | ICD-10-CM | POA: Diagnosis not present

## 2017-04-10 DIAGNOSIS — S32592A Other specified fracture of left pubis, initial encounter for closed fracture: Secondary | ICD-10-CM | POA: Diagnosis not present

## 2017-04-10 DIAGNOSIS — M1990 Unspecified osteoarthritis, unspecified site: Secondary | ICD-10-CM | POA: Diagnosis not present

## 2017-04-10 DIAGNOSIS — G8911 Acute pain due to trauma: Secondary | ICD-10-CM | POA: Diagnosis not present

## 2017-04-10 DIAGNOSIS — Z9181 History of falling: Secondary | ICD-10-CM | POA: Diagnosis not present

## 2017-04-10 DIAGNOSIS — F0280 Dementia in other diseases classified elsewhere without behavioral disturbance: Secondary | ICD-10-CM | POA: Diagnosis not present

## 2017-04-10 DIAGNOSIS — E7849 Other hyperlipidemia: Secondary | ICD-10-CM | POA: Diagnosis not present

## 2017-04-10 DIAGNOSIS — M8080XD Other osteoporosis with current pathological fracture, unspecified site, subsequent encounter for fracture with routine healing: Secondary | ICD-10-CM | POA: Diagnosis not present

## 2017-04-10 DIAGNOSIS — R5381 Other malaise: Secondary | ICD-10-CM | POA: Diagnosis not present

## 2017-04-10 MED ORDER — TRAMADOL HCL 50 MG PO TABS: 50.0000 mg | ORAL_TABLET | Freq: Four times a day (QID) | ORAL | 0 refills | Status: AC | PRN

## 2017-04-10 MED ORDER — ACETAMINOPHEN 325 MG PO TABS: 650.0000 mg | ORAL_TABLET | Freq: Four times a day (QID) | ORAL | Status: AC | PRN

## 2017-04-10 NOTE — Progress Notes (Addendum)
Attempted to call report x2 to [employer]. Was not able to speak with anyone via telephone. Gave report to [facility] transporters on patient's diagnosis, condition, mentation, and last PRN's given. Patient given tylenol  at 1726 prior to transport. Left message with return phone number with the Diplomatic Services operational officer at [facility].

## 2017-04-10 NOTE — Progress Notes (Signed)
CSW following for discharge to SNF. 

## 2017-04-10 NOTE — Discharge Summary (Signed)
Physician Discharge Summary  Sandra Tucker ZOX:[id number] DOB: [birth year] DOA: 04/08/2017  PCP: Agapito Games, MD  Admit date: 04/08/2017 Discharge date: 04/10/2017  Time spent: 35 minutes  Recommendations for Outpatient Follow-up:  1. Repeat BMET to follow electrolytes and renal function    Discharge Diagnoses:  Principal Problem:   Closed fracture of multiple pubic rami [facility]) Active Problems:   Alzheimer's disease   Fall   Gastroesophageal reflux disease   Discharge Condition: stable and improved. Discharge to SNF for further care and rehabilitation.  Diet recommendation: regular diet  Filed Weights   04/09/17 0456  Weight: 63.2 kg (139 lb 5.3 oz)    History of present illness:  83 y.o. female with a history of dementia was brought to the ER after patient had a witnessed fall. As per the ER physician patient's fall was witnessed and patient did not hit her head. Since patient had persistent pain patient was brought to the ER.   Hospital Course:  1-closed fracture of multiple pubic rami: left side with hematoma; non displaced -orthopedic service curbside in [place] and recommended conservative management. -PRN analgesics ordered -patient will be discharge to SNF for rehab  2-alzheimer disease -continue aricept -no behavioral problems  3-GERD -will continue use of PPI and carafate  4-glaucoma -will continue xalatan  Procedures:  See below for x-ray reports.  Consultations:  Ortho was curbside by ED physician (recommendations given for weight bearing as tolerated, no surgical intervention needed)  Discharge Exam: Vitals:   04/10/17 0538 04/10/17 0540  BP: (!) 150/82 (!) 149/75  Pulse: 71   Resp: 16   Temp: 98.2 F (36.8 C)   SpO2: 92%     General: afebrile, no CP, no SOB. Pain controlled with medications (still worse with movements, but expected). Cardiovascular: S1 and S2, no rubs, no gallops Respiratory: CTA bilaterally Abd: soft, NT, ND, positive  BS Extremities: no edema, no cyanosis   Discharge Instructions   Discharge Instructions    Discharge instructions    Complete by:  As directed    Weight bearing as tolerated PRN analgesics and supportive care Please maintain adequate hydration  Follow up with PCP in 10 days after discharge from SNF Rehabilitation as per SNF protocol Repeat BMET in 5 days to follow electrolytes and renal function     Current Discharge Medication List    START taking these medications   Details  acetaminophen (TYLENOL) 325 MG tablet Take 2 tablets (650 mg total) by mouth every 6 (six) hours as needed for mild pain or headache (or Fever >/= 101).      CONTINUE these medications which have CHANGED   Details  traMADol (ULTRAM) 50 MG tablet Take 1 tablet (50 mg total) by mouth every 6 (six) hours as needed for severe pain. Qty: 15 tablet, Refills: 0      CONTINUE these medications which have NOT CHANGED   Details  Carboxymethylcellulose Sod PF (REFRESH PLUS) 0.5 % SOLN Apply 1 drop to eye every 4 (four) hours as needed (dry eyes). Not to exceed 6 drops/24 hours    cholecalciferol (VITAMIN D) 1000 units tablet Take 1,000 Units by mouth daily.    donepezil (ARICEPT) 5 MG tablet Take 5 mg by mouth at bedtime.    famotidine (PEPCID) 20 MG tablet Take 1 tablet (20 mg total) by mouth 2 (two) times daily. Qty: 30 tablet, Refills: 0    latanoprost (XALATAN) 0.005 % ophthalmic solution Place 1 drop into both eyes at bedtime.  sucralfate (CARAFATE) 1 g tablet Take 1 tablet (1 g total) by mouth 4 (four) times daily. Qty: 30 tablet, Refills: 0    azelastine (ASTELIN) 0.1 % nasal spray Place 2 sprays into both nostrils daily. Use in each nostril as directed Qty: 30 mL, Refills: 12       Allergies  Allergen Reactions  . Namenda [Memantine Hcl] Other (See Comments)    Diarrhea, stool incontinence.    Follow-up Information    Agapito Games, MD. Schedule an appointment as soon as possible  for a visit in 10 day(s).   Specialty:  Family Medicine Why:  after discharge from SNF Contact information: [address] [place] [address] [phone]           The results of significant diagnostics from this hospitalization (including imaging, microbiology, ancillary and laboratory) are listed below for reference.    Significant Diagnostic Studies: Dg Lumbar Spine Complete  Result Date: 04/08/2017 CLINICAL DATA:  Fall with hip pain and low back pain. EXAM: LUMBAR SPINE - COMPLETE 4+ VIEW COMPARISON:  CT abdomen pelvis 11/20/2016 FINDINGS: L4-S1 posterior spinal fusion with transpedicular screws at L4 and S1. There is right convex lumbar scoliosis. No listhesis in the AP direction. No abnormal lucency surrounding the hardware. No acute fracture. The disc spaces are preserved. There is osteopenia of the L5 vertebral body and its cortex is difficult to see well. IMPRESSION: No acute fracture or static subluxation of the lumbar spine. Status post L4-S1 posterior spinal fusion. Limited assessment of the L5 vertebral body due to the degree of osteopenia and overlying structures. Electronically Signed   By: Deatra Robinson M.D.   On: 04/08/2017 20:50   Ct Hip Left Wo Contrast  Result Date: 04/08/2017 CLINICAL DATA:  Trip and fall injury.  Left hip pain. EXAM: CT OF THE LEFT HIP WITHOUT CONTRAST TECHNIQUE: Multidetector CT imaging of the left hip was performed according to the standard protocol. Multiplanar CT image reconstructions were also generated. COMPARISON:  Left hip radiographs 04/08/2017. CT abdomen and pelvis 11/20/2016. CT left hip 06/02/2016 FINDINGS: Bones/Joint/Cartilage Acute nondisplaced fractures of the left superior and inferior pubic rami. No evidence of acute fracture or dislocation of the left hip. Acetabulum appears intact. Degenerative changes in the left hip with narrowing and sclerosis of the superior and inferior joint spaces. Prominent hypertrophic  changes on the femoral and acetabular sides. Subcortical cysts. Ligaments Suboptimally assessed by CT. Muscles and Tendons Mild fatty atrophy of the gluteus muscles. No intramuscular infiltration. Soft tissues Small soft tissue hematoma surrounding the superior left pubic ramus. Infiltration in the subcutaneous fat lateral to the greater trochanter consistent with contusion. Visualized bladder is unremarkable. Diverticulosis of the visualized sigmoid colon without inflammatory changes identified. IMPRESSION: 1. Acute fractures of the left superior and inferior pubic rami. 2. Degenerative changes in the left hip joint. No evidence of hip fracture. 3. Small hematoma around the inferior pubic ramus. Focal contusion lateral to the left hip. 4. Diverticulosis of the sigmoid colon. Electronically Signed   By: Burman Nieves M.D.   On: 04/08/2017 22:59   Dg Hip Unilat With Pelvis 2-3 Views Left  Result Date: 04/08/2017 CLINICAL DATA:  Tripped or walking.  Left hip pain. EXAM: DG HIP (WITH OR WITHOUT PELVIS) 2-3V LEFT COMPARISON:  06/02/2016 pelvic and left hip radiographs. FINDINGS: No pelvic fracture or diastasis. No definite left hip fracture. No left hip dislocation. Stable chronic cortical irregularity at the superior aspect of the left femoral  neck near the femoral head. Mild osteoarthritis in both hip joints. No suspicious focal osseous lesions. Partially visualized surgical hardware overlying the bilateral lumbosacral junction. IMPRESSION: No fracture. No left hip dislocation. Mild bilateral hip osteoarthritis. Stable chronic cortical irregularity at the superior left femoral neck near the femoral head, characterized as chronic degenerative change on 06/02/2016 left hip CT study. Electronically Signed   By: Delbert Phenix M.D.   On: 04/08/2017 20:43    Microbiology: Recent Results (from the past 240 hour(s))  MRSA PCR Screening     Status: None   Collection Time: 04/09/17  6:26 AM  Result Value Ref Range  Status   MRSA by PCR NEGATIVE NEGATIVE Final    Comment:        The GeneXpert MRSA Assay (FDA approved for NASAL specimens only), is one component of a comprehensive MRSA colonization surveillance program. It is not intended to diagnose MRSA infection nor to guide or monitor treatment for MRSA infections.      Labs: Basic Metabolic Panel:  Recent Labs Lab 04/09/17 0053 04/09/17 0537  NA 138 138  K 3.6 3.8  CL 104 105  CO2 26 25  GLUCOSE 117* 138*  BUN 9 9  CREATININE 0.56 0.66  CALCIUM 9.1 8.9   CBC:  Recent Labs Lab 04/09/17 0053 04/09/17 0537  WBC 7.1 6.1  NEUTROABS 4.8  --   HGB 13.8 13.3  HCT 40.5 38.9  MCV 90.2 90.3  PLT 181 172     Signed:  Vassie Loll MD.  [facility] 04/10/2017, 3:45 PM

## 2017-04-10 NOTE — Progress Notes (Signed)
Received a call back from [facility]. Report given to [facility]. Patient has already been picked up for transport via [facility].  Arta Bruce [facility] 04/10/2017 5:52 PM

## 2017-04-10 NOTE — [place] FL2 (Signed)
[place] MEDICAID FL2 LEVEL OF CARE SCREENING TOOL     IDENTIFICATION  Patient Name: Sandra Tucker Birthdate: 07/11/1922 Sex: female Admission Date (Current Location): 04/08/2017  [facility] and [place] Number:  [place]   Facility and Address:  [facility],  [address], [place] [address]      Provider Number: [id number]  Attending Physician Name and Address:  Vassie Loll, MD  Relative Name and Phone Number:       Current Level of Care: Hospital Recommended Level of Care: Skilled Nursing Facility Prior Approval Number: [id number]  Date Approved/Denied:   PASRR Number:    Discharge Plan: SNF    Current Diagnoses: Patient Active Problem List   Diagnosis Date Noted  . Fall 04/09/2017  . Closed fracture of multiple pubic rami (HCC) 04/09/2017  . Hiatal hernia 01/30/2016  . Osteoporosis, unspecified 05/19/2013  . Macular degeneration 07/12/2012  . HYPERLIPIDEMIA 05/16/2010  . MOOD DISORDER 05/16/2010  . Alzheimer's disease 05/16/2010    Orientation RESPIRATION BLADDER Height & Weight     Self  Normal Continent Weight: 139 lb 5.3 oz (63.2 kg) Height:   (175.3 cm)  BEHAVIORAL SYMPTOMS/MOOD NEUROLOGICAL BOWEL NUTRITION STATUS      Continent Diet (regular)  AMBULATORY STATUS COMMUNICATION OF NEEDS Skin   Extensive Assist Verbally Normal                       Personal Care Assistance Level of Assistance  Bathing, Feeding, Dressing Bathing Assistance: Limited assistance Feeding assistance: Limited assistance Dressing Assistance: Limited assistance     Functional Limitations Info             SPECIAL CARE FACTORS FREQUENCY  PT (By licensed PT), OT (By licensed OT)     PT Frequency: 5x OT Frequency: 5x            Contractures Contractures Info: Not present    Additional Factors Info  Code Status, Allergies Code Status Info: Full Code Allergies Info: Namenda Memantine Hcl           Current Medications (04/10/2017):   This is the current hospital active medication list Current Facility-Administered Medications  Medication Dose Route Frequency Provider Last Rate Last Dose  . acetaminophen (TYLENOL) tablet 650 mg  650 mg Oral Q6H PRN Eduard Clos, MD   650 mg at 04/09/17 1244   Or  . acetaminophen (TYLENOL) suppository 650 mg  650 mg Rectal Q6H PRN Eduard Clos, MD      . cholecalciferol (VITAMIN D) tablet 1,000 Units  1,000 Units Oral Daily Eduard Clos, MD   1,000 Units at 04/10/17 [phone]  . donepezil (ARICEPT) tablet 5 mg  5 mg Oral QHS Eduard Clos, MD   5 mg at 04/09/17 2249  . famotidine (PEPCID) tablet 20 mg  20 mg Oral BID Eduard Clos, MD   20 mg at 04/10/17 0846  . latanoprost (XALATAN) 0.005 % ophthalmic solution 1 drop  1 drop Both Eyes QHS Eduard Clos, MD   1 drop at 04/10/17 0009  . ondansetron (ZOFRAN) tablet 4 mg  4 mg Oral Q6H PRN Eduard Clos, MD       Or  . ondansetron [facility]) injection 4 mg  4 mg Intravenous Q6H PRN Eduard Clos, MD      . polyvinyl alcohol (LIQUIFILM TEARS) 1.4 % ophthalmic solution 1 drop  1 drop Both Eyes Q4H PRN Eduard Clos, MD      .  sucralfate (CARAFATE) tablet 1 g  1 g Oral QID Eduard Clos, MD   1 g at 04/10/17 0846  . traMADol (ULTRAM) tablet 50 mg  50 mg Oral Q6H PRN Eduard Clos, MD   50 mg at 04/09/17 2249     Discharge Medications: Please see discharge summary for a list of discharge medications.  Relevant Imaging Results:  Relevant Lab Results:   Additional Information SSN [id number]  Antionette Poles, LCSW

## 2017-04-10 NOTE — Progress Notes (Signed)
CSW contacted by [employer] staff member Tammy and informed that patient has received authorization for 7 days. Auth # [id number].   CSW contacted patient's son and provided bed offers and inforemed him that insurance authorization has been received. Patient's son selected [facility]. CSW agreed to follow up with son after confirming bed offer.  CSW spoke with staff members from [facility] who confirmed patient's bed offer. CSW updated patient's son, patient's son reported that his aunt and uncle (Mr. & Mrs. Lovenia Kim [phone]) would be able to complete patient's paperwork.  CSW updated [employer] staff member Tammy, of patient's SNF selection. Staff agreed to update SNF with authorization information.   CSW updated patient's attending MD. CSW will continue to follow and assist with discharge planning.  Celso Sickle, [place] Clinical Social Worker [facility] Cell#: [phone]

## 2017-04-10 NOTE — Clinical Social Work Placement (Signed)
Patient received and accepted bed offer at [facility]. Facility aware of patient's discharge and confirmed bed offer. [facility] contacted, family notified. Patient's RN can call report to [phone], packet complete. CSW signing off, no other needs identified at this time.  CLINICAL SOCIAL WORK PLACEMENT  NOTE  Date:  04/10/2017  Patient Details  Name: Sandra Tucker MRN: [id number] Date of Birth: [birth year]  Clinical Social Work is seeking post-discharge placement for this patient at the Skilled  Nursing Facility level of care (*CSW will initial, date and re-position this form in  chart as items are completed):  Yes   Patient/family provided with [facility] Clinical Social Work Department's list of facilities offering this level of care within the geographic area requested by the patient (or if unable, by the patient's family).  Yes   Patient/family informed of their freedom to choose among providers that offer the needed level of care, that participate in Medicare, Medicaid or managed care program needed by the patient, have an available bed and are willing to accept the patient.  Yes   Patient/family informed of [facility]'s ownership interest in [facility] and [facility], as well as of the fact that they are under no obligation to receive care at these facilities.  PASRR submitted to EDS on       PASRR number received on 04/10/17     Existing PASRR number confirmed on       FL2 transmitted to all facilities in geographic area requested by pt/family on 04/10/17     FL2 transmitted to all facilities within larger geographic area on       Patient informed that his/her managed care company has contracts with or will negotiate with certain facilities, including the following:        Yes   Patient/family informed of bed offers received.  Patient chooses bed at [facility]     Physician recommends and patient chooses bed at      Patient to be transferred  to [facility] on 04/10/17.  Patient to be transferred to facility by [facility]     Patient family notified on 04/10/17 of transfer.  Name of family member notified:  Remer Macho      PHYSICIAN       Additional Comment:    _______________________________________________ Antionette Poles, LCSW 04/10/2017, 4:32 PM

## 2017-04-10 NOTE — Progress Notes (Signed)
CSW completed FL2 and started insurance authorization with [employer] Staff member Crystal.  CSW will provide family with bed offers and continue to follow to assist with dc planning.  Celso Sickle, [place] Clinical Social Worker [facility] Cell#: [phone]

## 2017-04-14 DIAGNOSIS — M25552 Pain in left hip: Secondary | ICD-10-CM | POA: Diagnosis not present

## 2017-04-14 DIAGNOSIS — R5381 Other malaise: Secondary | ICD-10-CM | POA: Diagnosis not present

## 2017-04-14 DIAGNOSIS — M6281 Muscle weakness (generalized): Secondary | ICD-10-CM | POA: Diagnosis not present

## 2017-04-14 DIAGNOSIS — R262 Difficulty in walking, not elsewhere classified: Secondary | ICD-10-CM | POA: Diagnosis not present

## 2017-04-16 DIAGNOSIS — R262 Difficulty in walking, not elsewhere classified: Secondary | ICD-10-CM | POA: Diagnosis not present

## 2017-04-16 DIAGNOSIS — M25552 Pain in left hip: Secondary | ICD-10-CM | POA: Diagnosis not present

## 2017-04-16 DIAGNOSIS — R5381 Other malaise: Secondary | ICD-10-CM | POA: Diagnosis not present

## 2017-04-16 DIAGNOSIS — M6281 Muscle weakness (generalized): Secondary | ICD-10-CM | POA: Diagnosis not present

## 2017-04-22 ENCOUNTER — Other Ambulatory Visit: Payer: Self-pay | Admitting: *Deleted

## 2017-04-22 NOTE — Patient Outreach (Signed)
[facility]) Care Management  04/22/2017  Sandra Tucker 1923/05/13 [id number]   Met with SW at facility, patient will discharge back to ALF, [facility] next week.  Plan to sign off as no [facility] community care management needs at this time. Royetta Crochet. Laymond Purser, RN, BSN, [place] [phone]) Business Cell  [phone]) Toll Free Office

## 2017-04-23 DIAGNOSIS — M6281 Muscle weakness (generalized): Secondary | ICD-10-CM | POA: Diagnosis not present

## 2017-04-23 DIAGNOSIS — M25552 Pain in left hip: Secondary | ICD-10-CM | POA: Diagnosis not present

## 2017-04-23 DIAGNOSIS — R262 Difficulty in walking, not elsewhere classified: Secondary | ICD-10-CM | POA: Diagnosis not present

## 2017-04-23 DIAGNOSIS — R5381 Other malaise: Secondary | ICD-10-CM | POA: Diagnosis not present

## 2017-04-28 DIAGNOSIS — K219 Gastro-esophageal reflux disease without esophagitis: Secondary | ICD-10-CM | POA: Diagnosis not present

## 2017-04-28 DIAGNOSIS — G309 Alzheimer's disease, unspecified: Secondary | ICD-10-CM | POA: Diagnosis not present

## 2017-04-28 DIAGNOSIS — S32592S Other specified fracture of left pubis, sequela: Secondary | ICD-10-CM | POA: Diagnosis not present

## 2017-04-28 DIAGNOSIS — Z9181 History of falling: Secondary | ICD-10-CM | POA: Diagnosis not present

## 2017-05-05 DIAGNOSIS — F3289 Other specified depressive episodes: Secondary | ICD-10-CM | POA: Diagnosis not present

## 2017-05-05 DIAGNOSIS — K219 Gastro-esophageal reflux disease without esophagitis: Secondary | ICD-10-CM | POA: Diagnosis not present

## 2017-05-05 DIAGNOSIS — F39 Unspecified mood [affective] disorder: Secondary | ICD-10-CM | POA: Diagnosis not present

## 2017-05-05 DIAGNOSIS — M859 Disorder of bone density and structure, unspecified: Secondary | ICD-10-CM | POA: Diagnosis not present

## 2017-05-05 DIAGNOSIS — H4089 Other specified glaucoma: Secondary | ICD-10-CM | POA: Diagnosis not present

## 2017-05-05 DIAGNOSIS — S32599S Other specified fracture of unspecified pubis, sequela: Secondary | ICD-10-CM | POA: Diagnosis not present

## 2017-05-05 DIAGNOSIS — G308 Other Alzheimer's disease: Secondary | ICD-10-CM | POA: Diagnosis not present

## 2017-05-05 DIAGNOSIS — H3530 Unspecified macular degeneration: Secondary | ICD-10-CM | POA: Diagnosis not present

## 2017-05-08 DIAGNOSIS — F0280 Dementia in other diseases classified elsewhere without behavioral disturbance: Secondary | ICD-10-CM | POA: Diagnosis not present

## 2017-05-08 DIAGNOSIS — H409 Unspecified glaucoma: Secondary | ICD-10-CM | POA: Diagnosis not present

## 2017-05-08 DIAGNOSIS — M80052D Age-related osteoporosis with current pathological fracture, left femur, subsequent encounter for fracture with routine healing: Secondary | ICD-10-CM | POA: Diagnosis not present

## 2017-05-08 DIAGNOSIS — K5730 Diverticulosis of large intestine without perforation or abscess without bleeding: Secondary | ICD-10-CM | POA: Diagnosis not present

## 2017-05-08 DIAGNOSIS — M160 Bilateral primary osteoarthritis of hip: Secondary | ICD-10-CM | POA: Diagnosis not present

## 2017-05-08 DIAGNOSIS — Z9181 History of falling: Secondary | ICD-10-CM | POA: Diagnosis not present

## 2017-05-08 DIAGNOSIS — H3530 Unspecified macular degeneration: Secondary | ICD-10-CM | POA: Diagnosis not present

## 2017-05-08 DIAGNOSIS — K219 Gastro-esophageal reflux disease without esophagitis: Secondary | ICD-10-CM | POA: Diagnosis not present

## 2017-05-08 DIAGNOSIS — G309 Alzheimer's disease, unspecified: Secondary | ICD-10-CM | POA: Diagnosis not present

## 2017-05-11 DIAGNOSIS — Z9181 History of falling: Secondary | ICD-10-CM | POA: Diagnosis not present

## 2017-05-11 DIAGNOSIS — M160 Bilateral primary osteoarthritis of hip: Secondary | ICD-10-CM | POA: Diagnosis not present

## 2017-05-11 DIAGNOSIS — H3530 Unspecified macular degeneration: Secondary | ICD-10-CM | POA: Diagnosis not present

## 2017-05-11 DIAGNOSIS — G309 Alzheimer's disease, unspecified: Secondary | ICD-10-CM | POA: Diagnosis not present

## 2017-05-11 DIAGNOSIS — H409 Unspecified glaucoma: Secondary | ICD-10-CM | POA: Diagnosis not present

## 2017-05-11 DIAGNOSIS — K5730 Diverticulosis of large intestine without perforation or abscess without bleeding: Secondary | ICD-10-CM | POA: Diagnosis not present

## 2017-05-11 DIAGNOSIS — K219 Gastro-esophageal reflux disease without esophagitis: Secondary | ICD-10-CM | POA: Diagnosis not present

## 2017-05-11 DIAGNOSIS — M80052D Age-related osteoporosis with current pathological fracture, left femur, subsequent encounter for fracture with routine healing: Secondary | ICD-10-CM | POA: Diagnosis not present

## 2017-05-11 DIAGNOSIS — F0280 Dementia in other diseases classified elsewhere without behavioral disturbance: Secondary | ICD-10-CM | POA: Diagnosis not present

## 2017-05-12 DIAGNOSIS — F0280 Dementia in other diseases classified elsewhere without behavioral disturbance: Secondary | ICD-10-CM | POA: Diagnosis not present

## 2017-05-12 DIAGNOSIS — G309 Alzheimer's disease, unspecified: Secondary | ICD-10-CM | POA: Diagnosis not present

## 2017-05-12 DIAGNOSIS — M80052D Age-related osteoporosis with current pathological fracture, left femur, subsequent encounter for fracture with routine healing: Secondary | ICD-10-CM | POA: Diagnosis not present

## 2017-05-12 DIAGNOSIS — H409 Unspecified glaucoma: Secondary | ICD-10-CM | POA: Diagnosis not present

## 2017-05-12 DIAGNOSIS — K219 Gastro-esophageal reflux disease without esophagitis: Secondary | ICD-10-CM | POA: Diagnosis not present

## 2017-05-12 DIAGNOSIS — M160 Bilateral primary osteoarthritis of hip: Secondary | ICD-10-CM | POA: Diagnosis not present

## 2017-05-12 DIAGNOSIS — H3530 Unspecified macular degeneration: Secondary | ICD-10-CM | POA: Diagnosis not present

## 2017-05-12 DIAGNOSIS — Z9181 History of falling: Secondary | ICD-10-CM | POA: Diagnosis not present

## 2017-05-12 DIAGNOSIS — K5730 Diverticulosis of large intestine without perforation or abscess without bleeding: Secondary | ICD-10-CM | POA: Diagnosis not present

## 2017-05-19 DIAGNOSIS — K5730 Diverticulosis of large intestine without perforation or abscess without bleeding: Secondary | ICD-10-CM | POA: Diagnosis not present

## 2017-05-19 DIAGNOSIS — F0280 Dementia in other diseases classified elsewhere without behavioral disturbance: Secondary | ICD-10-CM | POA: Diagnosis not present

## 2017-05-19 DIAGNOSIS — G309 Alzheimer's disease, unspecified: Secondary | ICD-10-CM | POA: Diagnosis not present

## 2017-05-19 DIAGNOSIS — K219 Gastro-esophageal reflux disease without esophagitis: Secondary | ICD-10-CM | POA: Diagnosis not present

## 2017-05-19 DIAGNOSIS — H409 Unspecified glaucoma: Secondary | ICD-10-CM | POA: Diagnosis not present

## 2017-05-19 DIAGNOSIS — M80052D Age-related osteoporosis with current pathological fracture, left femur, subsequent encounter for fracture with routine healing: Secondary | ICD-10-CM | POA: Diagnosis not present

## 2017-05-19 DIAGNOSIS — M160 Bilateral primary osteoarthritis of hip: Secondary | ICD-10-CM | POA: Diagnosis not present

## 2017-05-19 DIAGNOSIS — H3530 Unspecified macular degeneration: Secondary | ICD-10-CM | POA: Diagnosis not present

## 2017-05-19 DIAGNOSIS — Z9181 History of falling: Secondary | ICD-10-CM | POA: Diagnosis not present

## 2017-05-22 DIAGNOSIS — H43813 Vitreous degeneration, bilateral: Secondary | ICD-10-CM | POA: Diagnosis not present

## 2017-05-22 DIAGNOSIS — H353221 Exudative age-related macular degeneration, left eye, with active choroidal neovascularization: Secondary | ICD-10-CM | POA: Diagnosis not present

## 2017-05-22 DIAGNOSIS — H353213 Exudative age-related macular degeneration, right eye, with inactive scar: Secondary | ICD-10-CM | POA: Diagnosis not present

## 2017-05-22 DIAGNOSIS — H35423 Microcystoid degeneration of retina, bilateral: Secondary | ICD-10-CM | POA: Diagnosis not present

## 2017-05-26 DIAGNOSIS — M80052D Age-related osteoporosis with current pathological fracture, left femur, subsequent encounter for fracture with routine healing: Secondary | ICD-10-CM | POA: Diagnosis not present

## 2017-05-26 DIAGNOSIS — H409 Unspecified glaucoma: Secondary | ICD-10-CM | POA: Diagnosis not present

## 2017-05-26 DIAGNOSIS — K5730 Diverticulosis of large intestine without perforation or abscess without bleeding: Secondary | ICD-10-CM | POA: Diagnosis not present

## 2017-05-26 DIAGNOSIS — H3530 Unspecified macular degeneration: Secondary | ICD-10-CM | POA: Diagnosis not present

## 2017-05-26 DIAGNOSIS — Z9181 History of falling: Secondary | ICD-10-CM | POA: Diagnosis not present

## 2017-05-26 DIAGNOSIS — M160 Bilateral primary osteoarthritis of hip: Secondary | ICD-10-CM | POA: Diagnosis not present

## 2017-05-26 DIAGNOSIS — K219 Gastro-esophageal reflux disease without esophagitis: Secondary | ICD-10-CM | POA: Diagnosis not present

## 2017-05-26 DIAGNOSIS — G309 Alzheimer's disease, unspecified: Secondary | ICD-10-CM | POA: Diagnosis not present

## 2017-05-26 DIAGNOSIS — F0280 Dementia in other diseases classified elsewhere without behavioral disturbance: Secondary | ICD-10-CM | POA: Diagnosis not present

## 2017-06-19 DIAGNOSIS — B351 Tinea unguium: Secondary | ICD-10-CM | POA: Diagnosis not present

## 2017-07-07 ENCOUNTER — Other Ambulatory Visit: Payer: Self-pay

## 2017-07-07 ENCOUNTER — Emergency Department (HOSPITAL_COMMUNITY): Payer: PPO

## 2017-07-07 ENCOUNTER — Encounter (HOSPITAL_COMMUNITY): Payer: Self-pay | Admitting: *Deleted

## 2017-07-07 ENCOUNTER — Emergency Department (HOSPITAL_COMMUNITY)
Admission: EM | Admit: 2017-07-07 | Discharge: 2017-07-07 | Disposition: A | Discharge status: Skilled Nursing Facility | Payer: PPO | Attending: Emergency Medicine | Admitting: Emergency Medicine

## 2017-07-07 DIAGNOSIS — S62356A Nondisplaced fracture of shaft of fifth metacarpal bone, right hand, initial encounter for closed fracture: Secondary | ICD-10-CM | POA: Insufficient documentation

## 2017-07-07 DIAGNOSIS — S52501A Unspecified fracture of the lower end of right radius, initial encounter for closed fracture: Secondary | ICD-10-CM | POA: Diagnosis not present

## 2017-07-07 DIAGNOSIS — Z87891 Personal history of nicotine dependence: Secondary | ICD-10-CM | POA: Diagnosis not present

## 2017-07-07 DIAGNOSIS — Y999 Unspecified external cause status: Secondary | ICD-10-CM | POA: Diagnosis not present

## 2017-07-07 DIAGNOSIS — Z79899 Other long term (current) drug therapy: Secondary | ICD-10-CM | POA: Diagnosis not present

## 2017-07-07 DIAGNOSIS — S01511A Laceration without foreign body of lip, initial encounter: Secondary | ICD-10-CM | POA: Diagnosis not present

## 2017-07-07 DIAGNOSIS — T148XXA Other injury of unspecified body region, initial encounter: Secondary | ICD-10-CM | POA: Diagnosis not present

## 2017-07-07 DIAGNOSIS — S0993XA Unspecified injury of face, initial encounter: Secondary | ICD-10-CM

## 2017-07-07 DIAGNOSIS — F0390 Unspecified dementia without behavioral disturbance: Secondary | ICD-10-CM | POA: Diagnosis not present

## 2017-07-07 DIAGNOSIS — Y939 Activity, unspecified: Secondary | ICD-10-CM | POA: Insufficient documentation

## 2017-07-07 DIAGNOSIS — W06XXXA Fall from bed, initial encounter: Secondary | ICD-10-CM | POA: Insufficient documentation

## 2017-07-07 DIAGNOSIS — S022XXA Fracture of nasal bones, initial encounter for closed fracture: Secondary | ICD-10-CM | POA: Diagnosis not present

## 2017-07-07 DIAGNOSIS — S6291XA Unspecified fracture of right wrist and hand, initial encounter for closed fracture: Secondary | ICD-10-CM

## 2017-07-07 DIAGNOSIS — M25539 Pain in unspecified wrist: Secondary | ICD-10-CM | POA: Diagnosis not present

## 2017-07-07 DIAGNOSIS — M25531 Pain in right wrist: Secondary | ICD-10-CM | POA: Diagnosis not present

## 2017-07-07 DIAGNOSIS — Y929 Unspecified place or not applicable: Secondary | ICD-10-CM | POA: Diagnosis not present

## 2017-07-07 DIAGNOSIS — S199XXA Unspecified injury of neck, initial encounter: Secondary | ICD-10-CM | POA: Diagnosis not present

## 2017-07-07 DIAGNOSIS — S0990XA Unspecified injury of head, initial encounter: Secondary | ICD-10-CM | POA: Diagnosis not present

## 2017-07-07 LAB — CBC WITH DIFFERENTIAL/PLATELET
BASOS ABS: 0 10*3/uL (ref 0.0–0.1)
Basophils Relative: 1 %
EOS PCT: 1 %
Eosinophils Absolute: 0 10*3/uL (ref 0.0–0.7)
HCT: 44.3 % (ref 36.0–46.0)
Hemoglobin: 15 g/dL (ref 12.0–15.0)
LYMPHS ABS: 1.4 10*3/uL (ref 0.7–4.0)
LYMPHS PCT: 23 %
MCH: 30.8 pg (ref 26.0–34.0)
MCHC: 33.9 g/dL (ref 30.0–36.0)
MCV: 91 fL (ref 78.0–100.0)
MONO ABS: 0.4 10*3/uL (ref 0.1–1.0)
Monocytes Relative: 6 %
Neutro Abs: 4.2 10*3/uL (ref 1.7–7.7)
Neutrophils Relative %: 69 %
PLATELETS: 178 10*3/uL (ref 150–400)
RBC: 4.87 MIL/uL (ref 3.87–5.11)
RDW: 13.6 % (ref 11.5–15.5)
WBC: 6 10*3/uL (ref 4.0–10.5)

## 2017-07-07 LAB — BASIC METABOLIC PANEL
ANION GAP: 6 (ref 5–15)
BUN: 7 mg/dL (ref 6–20)
CHLORIDE: 106 mmol/L (ref 101–111)
CO2: 25 mmol/L (ref 22–32)
Calcium: 8.9 mg/dL (ref 8.9–10.3)
Creatinine, Ser: 0.6 mg/dL (ref 0.44–1.00)
GFR calc non Af Amer: >60 mL/min (ref >60)
GLUCOSE: 96 mg/dL (ref 65–99)
Potassium: 3.7 mmol/L (ref 3.5–5.1)
Sodium: 137 mmol/L (ref 135–145)

## 2017-07-07 LAB — URINALYSIS, ROUTINE W REFLEX MICROSCOPIC
Bilirubin Urine: NEGATIVE
GLUCOSE, UA: NEGATIVE mg/dL
HGB URINE DIPSTICK: NEGATIVE
KETONES UR: NEGATIVE mg/dL
Leukocytes, UA: NEGATIVE
Nitrite: NEGATIVE
PROTEIN: NEGATIVE mg/dL
Specific Gravity, Urine: 1.009 (ref 1.005–1.030)
pH: 7 (ref 5.0–8.0)

## 2017-07-07 MED ORDER — BACITRACIN ZINC 500 UNIT/GM EX OINT
TOPICAL_OINTMENT | CUTANEOUS | Status: AC
  Administered 2017-07-07: 1
  Filled 2017-07-07: qty 0.9

## 2017-07-07 MED ORDER — LIDOCAINE-EPINEPHRINE 2 %-1:100000 IJ SOLN: 20.0000 mL | Freq: Once | INTRAMUSCULAR | Status: DC

## 2017-07-07 MED ORDER — LIDOCAINE-EPINEPHRINE (PF) 2 %-1:200000 IJ SOLN
20.0000 mL | Freq: Once | INTRAMUSCULAR | Status: AC
  Administered 2017-07-07: 20 mL
  Filled 2017-07-07: qty 20

## 2017-07-07 NOTE — ED Notes (Signed)
[place] [phone]

## 2017-07-07 NOTE — ED Notes (Signed)
CHARGE PATTI D RN EVALUATED PT UPON ARRIVAL TO ROOM. PT STABLE.

## 2017-07-07 NOTE — ED Notes (Signed)
MADE AWARE OF FRACTURE, SUTURES, SLING. NEED FOR PCP FOLLOW. FAMILY WILL BE RETURNING HER TONIGHT

## 2017-07-07 NOTE — ED Notes (Signed)
Patient transported to X-ray 

## 2017-07-07 NOTE — ED Notes (Signed)
Bed: WA09 Expected date:  Expected time:  Means of arrival:  Comments: Hold ems lip lac arm injury

## 2017-07-07 NOTE — ED Provider Notes (Signed)
[facility]-EMERGENCY DEPT Provider Note   CSN: [id number] Arrival date & time: 07/07/17  1006     History   Chief Complaint Chief Complaint  Patient presents with  . Fall    HPI Sandra Tucker is a 84 y.o. female.  Patient reportedly fell out of bed.  She injured her face in the fall.  Fall was unwitnessed.  Patient cannot contribute to history.  Level 5 caveat-dementia  HPI  Past Medical History:  Diagnosis Date  . Alzheimer's dementia   . Hernia, diaphragmatic, without obstruction   . Hyperlipemia   . Macular degeneration syndrome   . Osteoarthritis     Patient Active Problem List   Diagnosis Date Noted  . Gastroesophageal reflux disease   . Fall 04/09/2017  . Closed fracture of multiple pubic rami (HCC) 04/09/2017  . Hiatal hernia 01/30/2016  . Osteoporosis, unspecified 05/19/2013  . Macular degeneration 07/12/2012  . HYPERLIPIDEMIA 05/16/2010  . MOOD DISORDER 05/16/2010  . Alzheimer's disease 05/16/2010    Past Surgical History:  Procedure Laterality Date  . ABDOMINAL HYSTERECTOMY      OB History    No data available       Home Medications    Prior to Admission medications   Medication Sig Start Date End Date Taking? Authorizing Provider  acetaminophen (TYLENOL) 325 MG tablet Take 2 tablets (650 mg total) by mouth every 6 (six) hours as needed for mild pain or headache (or Fever >/= 101). 04/10/17  Yes Vassie Loll, MD  azelastine (ASTELIN) 0.1 % nasal spray Place 2 sprays into both nostrils daily. Use in each nostril as directed 07/20/15  Yes Agapito Games, MD  cholecalciferol (VITAMIN D) 1000 units tablet Take 1,000 Units by mouth daily.   Yes [provider]  donepezil (ARICEPT) 5 MG tablet Take 5 mg by mouth at bedtime. 10/20/16  Yes [provider]  famotidine (PEPCID) 20 MG tablet Take 1 tablet (20 mg total) by mouth 2 (two) times daily. 11/20/16  Yes Lorre Nick, MD  latanoprost (XALATAN) 0.005  % ophthalmic solution Place 1 drop into both eyes at bedtime. 11/09/16  Yes [provider]  sucralfate (CARAFATE) 1 g tablet Take 1 tablet (1 g total) by mouth 4 (four) times daily. 11/20/16  Yes Lorre Nick, MD  traMADol (ULTRAM) 50 MG tablet Take 1 tablet (50 mg total) by mouth every 6 (six) hours as needed for severe pain. 04/10/17  Yes Vassie Loll, MD  Carboxymethylcellulose Sod PF (REFRESH PLUS) 0.5 % SOLN Apply 1 drop to eye every 4 (four) hours as needed (dry eyes). Not to exceed 6 drops/24 hours    [provider]    Family History Family History  Problem Relation Age of Onset  . Hyperlipidemia Son   . Hypertension Son   . Heart attack Unknown   . Dementia Unknown     Social History Social History   Tobacco Use  . Smoking status: Former Smoker    Types: Cigarettes    Last attempt to quit: 07/07/2000    Years since quitting: 17.0  . Smokeless tobacco: Never Used  Substance Use Topics  . Alcohol use: No  . Drug use: No     Allergies   Namenda [memantine hcl]   Review of Systems Review of Systems  Unable to perform ROS: Dementia     Physical Exam Updated Vital Signs BP (!) 137/94 (BP Location: Left Arm)   Pulse 92   Temp 98.6 F (37 C) (  Oral)   Resp 18   Ht 5\' 10"  (1.778 m)   Wt 63.5 kg (140 lb)   SpO2 96%   BMI 20.09 kg/m   Physical Exam  Constitutional: She appears well-developed.  Elderly, frail  HENT:  Head: Normocephalic.  Right Ear: External ear normal.  Left Ear: External ear normal.  Contusion with laceration upper lip.  Laceration is through and through.  No apparent trismus.  No midface instability or crepitation.  No obvious dental injury.  The patient has very poor dentition primarily upper, with numerous large cavities, and erosion to the base of the teeth.  Mild abrasion around the upper teeth, bilaterally.  Eyes: Conjunctivae and EOM are normal. Pupils are equal, round, and reactive to light.  Neck: Normal range of  motion and phonation normal. Neck supple.  Cardiovascular: Normal rate.  Pulmonary/Chest: Effort normal. She exhibits no tenderness and no bony tenderness.  Musculoskeletal: Normal range of motion.  Neurological: She is alert. No cranial nerve deficit or sensory deficit. She exhibits normal muscle tone. Coordination normal.  Skin: Skin is warm, dry and intact.  Psychiatric: She has a normal mood and affect. Her behavior is normal.  Nursing note and vitals reviewed.    ED Treatments / Results  Labs (all labs ordered are listed, but only abnormal results are displayed) Labs Reviewed  BASIC METABOLIC PANEL  CBC WITH DIFFERENTIAL/PLATELET  URINALYSIS, ROUTINE W REFLEX MICROSCOPIC    EKG  EKG Interpretation None       Radiology Dg Wrist Complete Right  Result Date: 07/07/2017 CLINICAL DATA:  Fall.  Right wrist pain. EXAM: RIGHT WRIST - COMPLETE 3+ VIEW COMPARISON:  None. FINDINGS: Slightly impacted nondisplaced distal metaphysis fracture in the right radius, possibly extending to the distal articular surface. Nondisplaced non articular spiral fracture of the shaft of the right fifth metacarpal. No additional fracture. No dislocation. No suspicious focal osseous lesion. Severe first carpometacarpal joint osteoarthritis. Soft tissue swelling in the right wrist and hand. No radiopaque foreign body. IMPRESSION: 1. Impacted right distal radius fracture, possibly intra-articular. 2. Nondisplaced right fifth metacarpal shaft fracture. 3. Severe first carpometacarpal joint osteoarthritis. Electronically Signed   By: Delbert PhenixJason A Poff M.D.   On: 07/07/2017 11:01   Ct Head Wo Contrast  Result Date: 07/07/2017 CLINICAL DATA:  84 year old female with a history of fall EXAM: CT HEAD WITHOUT CONTRAST CT MAXILLOFACIAL WITHOUT CONTRAST CT CERVICAL SPINE WITHOUT CONTRAST TECHNIQUE: Multidetector CT imaging of the head, cervical spine, and maxillofacial structures were performed using the standard protocol  without intravenous contrast. Multiplanar CT image reconstructions of the cervical spine and maxillofacial structures were also generated. COMPARISON:  None. FINDINGS: CT HEAD FINDINGS Brain: No acute intracranial hemorrhage. No midline shift or mass effect. Gray-white differentiation is maintained. Mild hypodensity in the periventricular white matter. Hypodensity in the left anterior limb internal capsule. Mild brain volume loss. Unremarkable configuration the ventricles. Vascular: Calcifications of the intracranial circulation. Skull: No acute fracture.  No focal soft tissue swelling. Other: Unremarkable appearance of the right orbit. Left lens extraction CT MAXILLOFACIAL FINDINGS Osseous: Fractures of bilateral nasal bones with minimal angulation and no significant displacement. Associated fracture the bony nasal septum. No mandibular fracture. Endodontal disease. Soft tissue associated with the left lateral maxillary incisor of intermediate density. Orbits: Unremarkable appearance of the right orbit. Evidence of left lens extraction. Otherwise unremarkable orbits. Sinuses: Frontal sinuses clear. Ethmoid air cells relatively clear with minimal soft tissue underlying the nasal bone fractures. Maxillary sinuses clear. Sphenoid sinuses clear. Soft  tissues: Mild soft tissue swelling associated with the nasal bones. CT CERVICAL SPINE FINDINGS Alignment: No acute displacement of the cervical elements. Trace anterolisthesis of C7 on T1 is likely degenerative. Facets maintain alignment bilaterally. Skull base and vertebrae: Craniocervical junction unremarkable. No skullbase fracture identified. No acute fracture of the vertebral bodies with vertebral body heights maintained. Soft tissues and spinal canal: Unremarkable appearance of the soft tissues. No significant vascular calcifications. No adenopathy. No canal hematoma. Disc levels: Disc space narrowing throughout the cervical spine with endplate changes and  uncovertebral joint disease. Greatest degree of degenerative changes present at the C4-C5, C5-C6, and C6-C7 levels. Right greater than left foraminal narrowing present secondary to uncovertebral joint disease and facet spurring. No large central disc protrusions identified. Upper chest: Likely scarring/ pleuroparenchymal thickening at the apices of the lungs with no pneumothorax. Other: None IMPRESSION: Head CT: No acute intracranial finding on head CT. Mild volume loss and evidence of chronic microvascular ischemic disease, with potential prior lacunar infarction at the anterior left internal capsule. Associated intracranial atherosclerosis. Maxillofacial CT: Fractures of the bilateral nasal bones, potentially acute as there is mild associated soft tissue swelling. There is associated fracture of the nasal septum. Endodontal disease, with likely chronic changes associated with the root of the right maxillary lateral incisor. Cervical CT: No CT evidence of acute fracture or malalignment of the cervical spine. Multilevel degenerative changes of the cervical spine, as above. Electronically Signed   By: Gilmer Mor D.O.   On: 07/07/2017 11:13   Ct Cervical Spine Wo Contrast  Result Date: 07/07/2017 CLINICAL DATA:  84 year old female with a history of fall EXAM: CT HEAD WITHOUT CONTRAST CT MAXILLOFACIAL WITHOUT CONTRAST CT CERVICAL SPINE WITHOUT CONTRAST TECHNIQUE: Multidetector CT imaging of the head, cervical spine, and maxillofacial structures were performed using the standard protocol without intravenous contrast. Multiplanar CT image reconstructions of the cervical spine and maxillofacial structures were also generated. COMPARISON:  None. FINDINGS: CT HEAD FINDINGS Brain: No acute intracranial hemorrhage. No midline shift or mass effect. Gray-white differentiation is maintained. Mild hypodensity in the periventricular white matter. Hypodensity in the left anterior limb internal capsule. Mild brain volume loss.  Unremarkable configuration the ventricles. Vascular: Calcifications of the intracranial circulation. Skull: No acute fracture.  No focal soft tissue swelling. Other: Unremarkable appearance of the right orbit. Left lens extraction CT MAXILLOFACIAL FINDINGS Osseous: Fractures of bilateral nasal bones with minimal angulation and no significant displacement. Associated fracture the bony nasal septum. No mandibular fracture. Endodontal disease. Soft tissue associated with the left lateral maxillary incisor of intermediate density. Orbits: Unremarkable appearance of the right orbit. Evidence of left lens extraction. Otherwise unremarkable orbits. Sinuses: Frontal sinuses clear. Ethmoid air cells relatively clear with minimal soft tissue underlying the nasal bone fractures. Maxillary sinuses clear. Sphenoid sinuses clear. Soft tissues: Mild soft tissue swelling associated with the nasal bones. CT CERVICAL SPINE FINDINGS Alignment: No acute displacement of the cervical elements. Trace anterolisthesis of C7 on T1 is likely degenerative. Facets maintain alignment bilaterally. Skull base and vertebrae: Craniocervical junction unremarkable. No skullbase fracture identified. No acute fracture of the vertebral bodies with vertebral body heights maintained. Soft tissues and spinal canal: Unremarkable appearance of the soft tissues. No significant vascular calcifications. No adenopathy. No canal hematoma. Disc levels: Disc space narrowing throughout the cervical spine with endplate changes and uncovertebral joint disease. Greatest degree of degenerative changes present at the C4-C5, C5-C6, and C6-C7 levels. Right greater than left foraminal narrowing present secondary to uncovertebral joint disease and  facet spurring. No large central disc protrusions identified. Upper chest: Likely scarring/ pleuroparenchymal thickening at the apices of the lungs with no pneumothorax. Other: None IMPRESSION: Head CT: No acute intracranial finding  on head CT. Mild volume loss and evidence of chronic microvascular ischemic disease, with potential prior lacunar infarction at the anterior left internal capsule. Associated intracranial atherosclerosis. Maxillofacial CT: Fractures of the bilateral nasal bones, potentially acute as there is mild associated soft tissue swelling. There is associated fracture of the nasal septum. Endodontal disease, with likely chronic changes associated with the root of the right maxillary lateral incisor. Cervical CT: No CT evidence of acute fracture or malalignment of the cervical spine. Multilevel degenerative changes of the cervical spine, as above. Electronically Signed   By: Gilmer Mor D.O.   On: 07/07/2017 11:13   Ct Maxillofacial Wo Cm  Result Date: 07/07/2017 CLINICAL DATA:  84 year old female with a history of fall EXAM: CT HEAD WITHOUT CONTRAST CT MAXILLOFACIAL WITHOUT CONTRAST CT CERVICAL SPINE WITHOUT CONTRAST TECHNIQUE: Multidetector CT imaging of the head, cervical spine, and maxillofacial structures were performed using the standard protocol without intravenous contrast. Multiplanar CT image reconstructions of the cervical spine and maxillofacial structures were also generated. COMPARISON:  None. FINDINGS: CT HEAD FINDINGS Brain: No acute intracranial hemorrhage. No midline shift or mass effect. Gray-white differentiation is maintained. Mild hypodensity in the periventricular white matter. Hypodensity in the left anterior limb internal capsule. Mild brain volume loss. Unremarkable configuration the ventricles. Vascular: Calcifications of the intracranial circulation. Skull: No acute fracture.  No focal soft tissue swelling. Other: Unremarkable appearance of the right orbit. Left lens extraction CT MAXILLOFACIAL FINDINGS Osseous: Fractures of bilateral nasal bones with minimal angulation and no significant displacement. Associated fracture the bony nasal septum. No mandibular fracture. Endodontal disease. Soft  tissue associated with the left lateral maxillary incisor of intermediate density. Orbits: Unremarkable appearance of the right orbit. Evidence of left lens extraction. Otherwise unremarkable orbits. Sinuses: Frontal sinuses clear. Ethmoid air cells relatively clear with minimal soft tissue underlying the nasal bone fractures. Maxillary sinuses clear. Sphenoid sinuses clear. Soft tissues: Mild soft tissue swelling associated with the nasal bones. CT CERVICAL SPINE FINDINGS Alignment: No acute displacement of the cervical elements. Trace anterolisthesis of C7 on T1 is likely degenerative. Facets maintain alignment bilaterally. Skull base and vertebrae: Craniocervical junction unremarkable. No skullbase fracture identified. No acute fracture of the vertebral bodies with vertebral body heights maintained. Soft tissues and spinal canal: Unremarkable appearance of the soft tissues. No significant vascular calcifications. No adenopathy. No canal hematoma. Disc levels: Disc space narrowing throughout the cervical spine with endplate changes and uncovertebral joint disease. Greatest degree of degenerative changes present at the C4-C5, C5-C6, and C6-C7 levels. Right greater than left foraminal narrowing present secondary to uncovertebral joint disease and facet spurring. No large central disc protrusions identified. Upper chest: Likely scarring/ pleuroparenchymal thickening at the apices of the lungs with no pneumothorax. Other: None IMPRESSION: Head CT: No acute intracranial finding on head CT. Mild volume loss and evidence of chronic microvascular ischemic disease, with potential prior lacunar infarction at the anterior left internal capsule. Associated intracranial atherosclerosis. Maxillofacial CT: Fractures of the bilateral nasal bones, potentially acute as there is mild associated soft tissue swelling. There is associated fracture of the nasal septum. Endodontal disease, with likely chronic changes associated with the  root of the right maxillary lateral incisor. Cervical CT: No CT evidence of acute fracture or malalignment of the cervical spine. Multilevel degenerative changes of the  cervical spine, as above. Electronically Signed   By: Gilmer Mor D.O.   On: 07/07/2017 11:13    Procedures .Marland KitchenLaceration Repair Date/Time: 07/07/2017 3:03 PM Performed by: Mancel Bale, MD Authorized by: Mancel Bale, MD   Consent:    Consent obtained:  Verbal   Consent given by:  Guardian   Risks discussed:  Pain   Alternatives discussed:  No treatment Anesthesia (see MAR for exact dosages):    Anesthesia method:  Local infiltration   Local anesthetic:  Lidocaine 2% WITH epi Laceration details:    Location:  Lip   Lip location:  Upper exterior lip   Length (cm):  3.5 Pre-procedure details:    Preparation:  Patient was prepped and draped in usual sterile fashion Exploration:    Hemostasis achieved with:  Direct pressure   Wound exploration: wound explored through full range of motion     Wound extent: no areolar tissue violation noted and no foreign bodies/material noted     Contaminated: no   Treatment:    Area cleansed with:  Shur-Clens   Amount of cleaning:  Standard   Irrigation solution:  Sterile saline   Visualized foreign bodies/material removed: no   Skin repair:    Repair method:  Sutures   Suture size:  5-0   Wound skin closure material used: Vicryl Rapide. Approximation:    Approximation:  Loose   Vermilion border: well-aligned   Post-procedure details:    Dressing:  Antibiotic ointment   Patient tolerance of procedure:  Tolerated well, no immediate complications    (including critical care time)  Medications Ordered in ED Medications  lidocaine-EPINEPHrine (XYLOCAINE W/EPI) 2 %-1:200000 (PF) injection 20 mL (20 mLs Infiltration Given 07/07/17 1434)  bacitracin 500 UNIT/GM ointment (1 application  Given 07/07/17 1600)     Initial Impression / Assessment and Plan / ED Course  I have  reviewed the triage vital signs and the nursing notes.  Pertinent labs & imaging results that were available during my care of the patient were reviewed by me and considered in my medical decision making (see chart for details).      Patient Vitals for the past 24 hrs:  BP Temp Temp src Pulse Resp SpO2 Height Weight  07/07/17 1627 (!) 137/94 98.6 F (37 C) Oral 92 18 96 % - -  07/07/17 1542 (!) 137/94 - - 100 18 98 % - -  07/07/17 1234 (!) 129/94 - - 84 18 100 % - -  07/07/17 1113 - - - - - - 5\' 10"  (1.778 m) 63.5 kg (140 lb)  07/07/17 1024 (!) 159/95 98.9 F (37.2 C) Rectal 93 18 92 % - -  07/07/17 1013 - - - - - 98 % - -    3:05 PM Reevaluation with update and discussion. After initial assessment and treatment, an updated evaluation reveals no further complaints.  Clinical exam unchanged.  Findings discussed with patient's family members, all questions answered. Mancel Bale       Final Clinical Impressions(s) / ED Diagnoses   Final diagnoses:  Facial injury, initial encounter  Lip laceration, initial encounter  Closed fracture of distal end of right radius, unspecified fracture morphology, initial encounter  Closed fracture of right hand, initial encounter   Fall, likely mechanical, with multiple injuries.  Doubt serious intracranial or spinal injury.  Doubt inciting cause such as urinary tract infection or metabolic instability.  Facial lacerations repaired, associated with intraoral injury, mild abrasion over teeth which are carious.  Doubt  facial fracture.  Right hand and right wrist fracture, not requiring reduction in the emergency department.  Patient with dementia, and may not tolerate splinting for prolonged period time.  However it is unlikely that these fractures will move if she removes her splint.  Number members updated on findings and plan.  Nursing Notes Reviewed/ Care Coordinated Applicable Imaging Reviewed Interpretation of Laboratory Data incorporated into  ED treatment  The patient appears reasonably screened and/or stabilized for discharge and I doubt any other medical condition or other [facility] requiring further screening, evaluation, or treatment in the ED at this time prior to discharge.  Plan: Home Medications-continue current home medications; Home Treatments-splint clear and sling as needed, wound care twice a day; return here if the recommended treatment, does not improve the symptoms; Recommended follow up-suture removal and PCP follow-up 1 week with consideration for orthopedic referral at that time.     ED Discharge Orders    None       Mancel Bale, MD 07/08/17 [phone]

## 2017-07-07 NOTE — ED Triage Notes (Addendum)
EMS reports pt from [facility], pt fell out of bed, top teeth? went into upper lip, still bleeding, rt arm injury wrist swelling, good pulse. Splint on and c-collar, pt has dementia, unwitnessed fall.

## 2017-07-07 NOTE — Discharge Instructions (Signed)
Keep the wound on your upper lip clean with gentle saline cleansing daily.  Have the suture removed in 1 week.  Try to rinse the mouth out with warm salt water after eating, or at least 3 times a day.  Start with a soft diet, to help you be able to chew and swallow.  Try to keep the splint on the right arm, to treat the radius and metacarpal fractures.  Follow-up with an orthopedic doctor or your primary care doctor for management of the fractures, in 1 or 2 weeks.  Return here, if needed, for problems.

## 2017-07-07 NOTE — ED Notes (Addendum)
ED Provider at bedside. EDP WENTZ SUTURING AT BEDSIDE. KRISTIN RN ASSISTING

## 2017-07-07 NOTE — ED Notes (Signed)
Updated pt family on CT results and informed them that ED Provider will be in as soon as ED Provider can. Family sitting patiently and understand delay.

## 2017-07-07 NOTE — ED Notes (Signed)
Pt has removed right arm brace.

## 2017-07-07 NOTE — ED Notes (Signed)
Gaylord ShihTHO Dereck LeepQUINTIN AT BEDSIDE SPLINTING RUE

## 2017-07-09 DIAGNOSIS — S52501S Unspecified fracture of the lower end of right radius, sequela: Secondary | ICD-10-CM | POA: Diagnosis not present

## 2017-07-09 DIAGNOSIS — F39 Unspecified mood [affective] disorder: Secondary | ICD-10-CM | POA: Diagnosis not present

## 2017-07-09 DIAGNOSIS — G309 Alzheimer's disease, unspecified: Secondary | ICD-10-CM | POA: Diagnosis not present

## 2017-07-09 DIAGNOSIS — R2689 Other abnormalities of gait and mobility: Secondary | ICD-10-CM | POA: Diagnosis not present

## 2017-07-09 DIAGNOSIS — M6281 Muscle weakness (generalized): Secondary | ICD-10-CM | POA: Diagnosis not present

## 2017-07-14 DIAGNOSIS — E785 Hyperlipidemia, unspecified: Secondary | ICD-10-CM | POA: Diagnosis not present

## 2017-07-14 DIAGNOSIS — G301 Alzheimer's disease with late onset: Secondary | ICD-10-CM | POA: Diagnosis not present

## 2017-07-14 DIAGNOSIS — S01511S Laceration without foreign body of lip, sequela: Secondary | ICD-10-CM | POA: Diagnosis not present

## 2017-07-14 DIAGNOSIS — F39 Unspecified mood [affective] disorder: Secondary | ICD-10-CM | POA: Diagnosis not present

## 2017-07-14 DIAGNOSIS — Z9181 History of falling: Secondary | ICD-10-CM | POA: Diagnosis not present

## 2017-07-14 DIAGNOSIS — H4089 Other specified glaucoma: Secondary | ICD-10-CM | POA: Diagnosis not present

## 2017-07-14 DIAGNOSIS — S6291XS Unspecified fracture of right wrist and hand, sequela: Secondary | ICD-10-CM | POA: Diagnosis not present

## 2017-08-14 DIAGNOSIS — H43813 Vitreous degeneration, bilateral: Secondary | ICD-10-CM | POA: Diagnosis not present

## 2017-08-14 DIAGNOSIS — H35423 Microcystoid degeneration of retina, bilateral: Secondary | ICD-10-CM | POA: Diagnosis not present

## 2017-08-14 DIAGNOSIS — H353221 Exudative age-related macular degeneration, left eye, with active choroidal neovascularization: Secondary | ICD-10-CM | POA: Diagnosis not present

## 2017-08-14 DIAGNOSIS — H353213 Exudative age-related macular degeneration, right eye, with inactive scar: Secondary | ICD-10-CM | POA: Diagnosis not present

## 2017-11-17 DIAGNOSIS — H35423 Microcystoid degeneration of retina, bilateral: Secondary | ICD-10-CM | POA: Diagnosis not present

## 2017-11-17 DIAGNOSIS — H353213 Exudative age-related macular degeneration, right eye, with inactive scar: Secondary | ICD-10-CM | POA: Diagnosis not present

## 2017-11-17 DIAGNOSIS — H353221 Exudative age-related macular degeneration, left eye, with active choroidal neovascularization: Secondary | ICD-10-CM | POA: Diagnosis not present

## 2017-11-17 DIAGNOSIS — H43813 Vitreous degeneration, bilateral: Secondary | ICD-10-CM | POA: Diagnosis not present

## 2017-12-16 DIAGNOSIS — B351 Tinea unguium: Secondary | ICD-10-CM | POA: Diagnosis not present

## 2018-01-06 ENCOUNTER — Telehealth: Payer: Self-pay

## 2018-01-06 NOTE — Telephone Encounter (Signed)
Carey BullocksJennifer Taylor with the sales department with [facility] [place] is awaiting of a fax form she sent. Please advise.

## 2018-01-06 NOTE — Telephone Encounter (Signed)
Orders placed in box to be faxed.  I filled them out the best that I could considering that have not actually seen her in 2 years.

## 2018-01-06 NOTE — Telephone Encounter (Signed)
Sandra BullocksJennifer Tucker advised.

## 2018-01-06 NOTE — Telephone Encounter (Signed)
Form completed,faxed,scanned, confirmation received .Heath GoldBarkley, Rosenda Geffrard Lynetta, CMA

## 2018-06-05 IMAGING — CR DG LUMBAR SPINE COMPLETE 4+V
5 series · 5 of 5 positions shown · non-contrast
Comparison: CT abdomen pelvis 11/20/2016

CLINICAL DATA: Fall with hip pain and low back pain.

EXAM:
LUMBAR SPINE - COMPLETE 4+ VIEW

[t lumbar spine obl (1 of 3)]
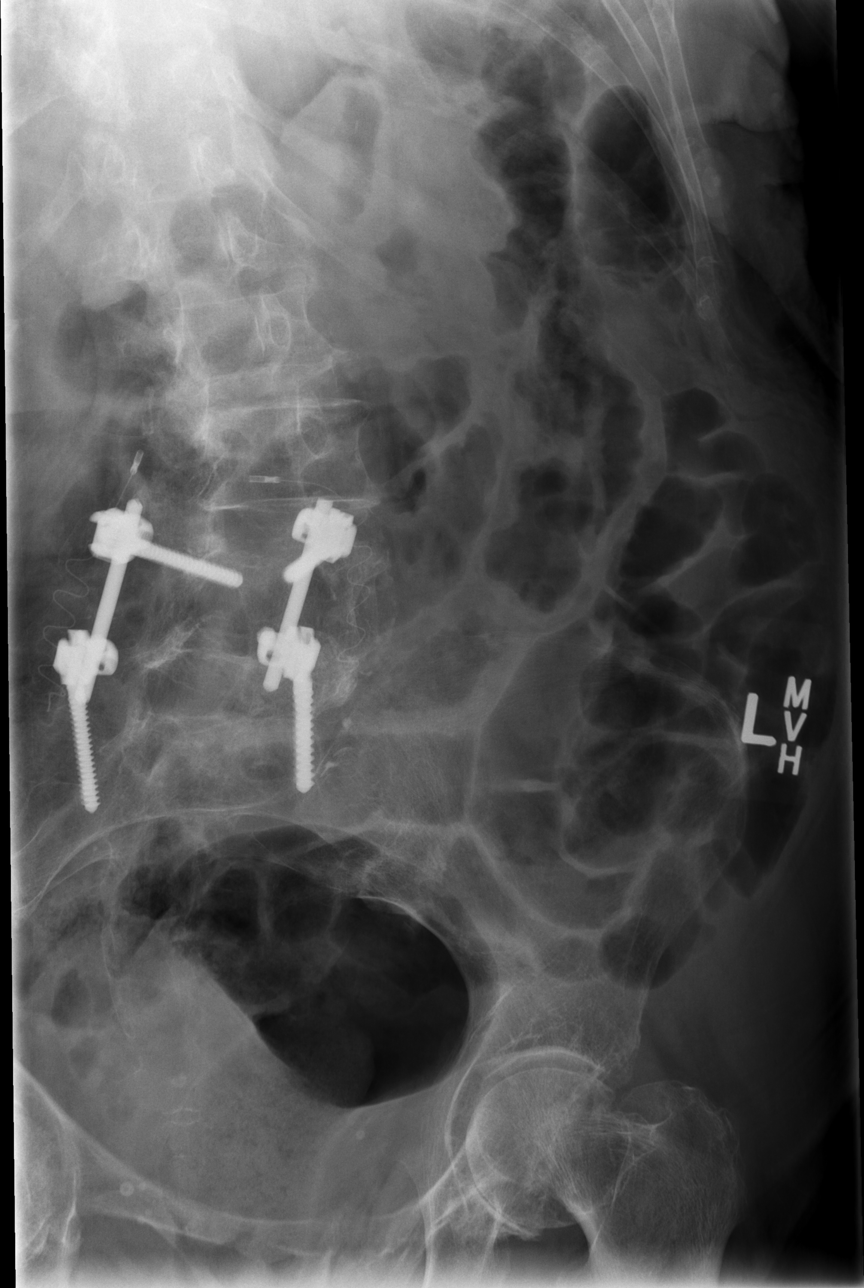

[t lumbar spine obl (2 of 3)]
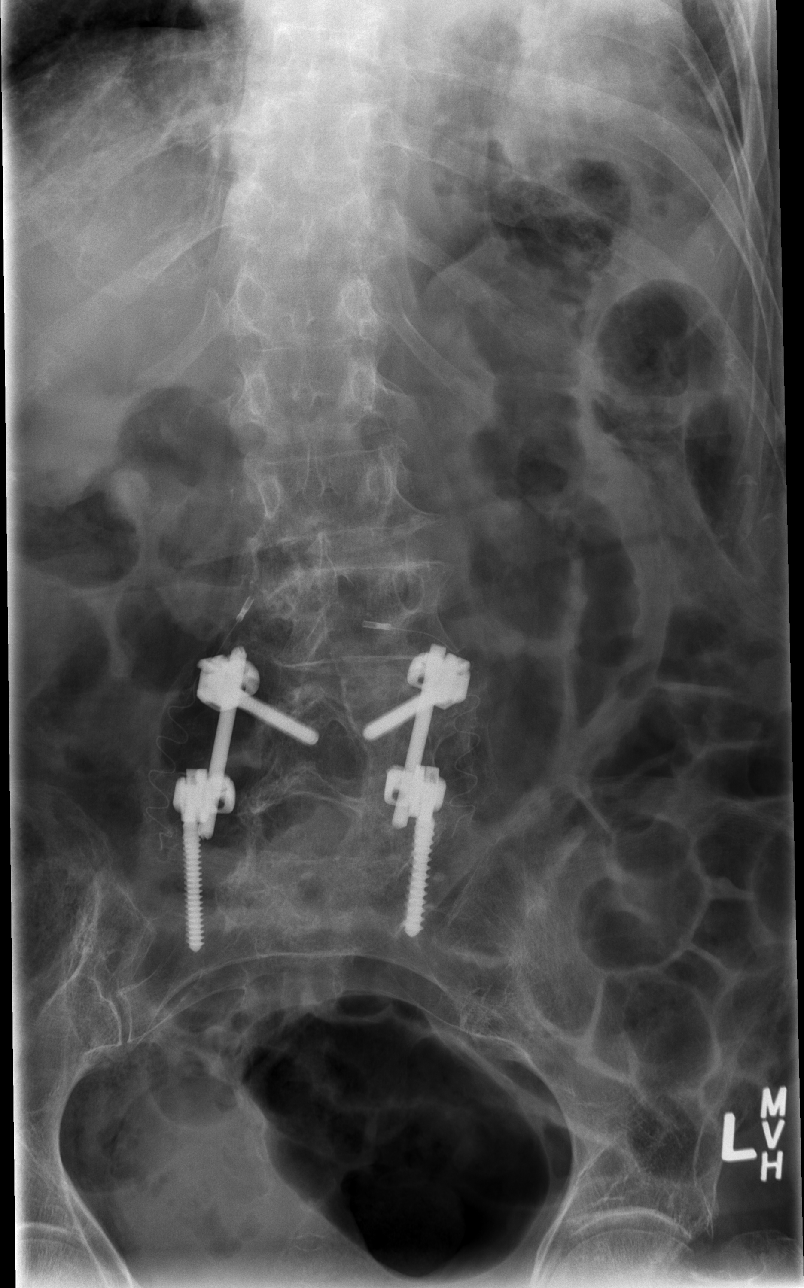

[t lumbar spine obl (3 of 3)]
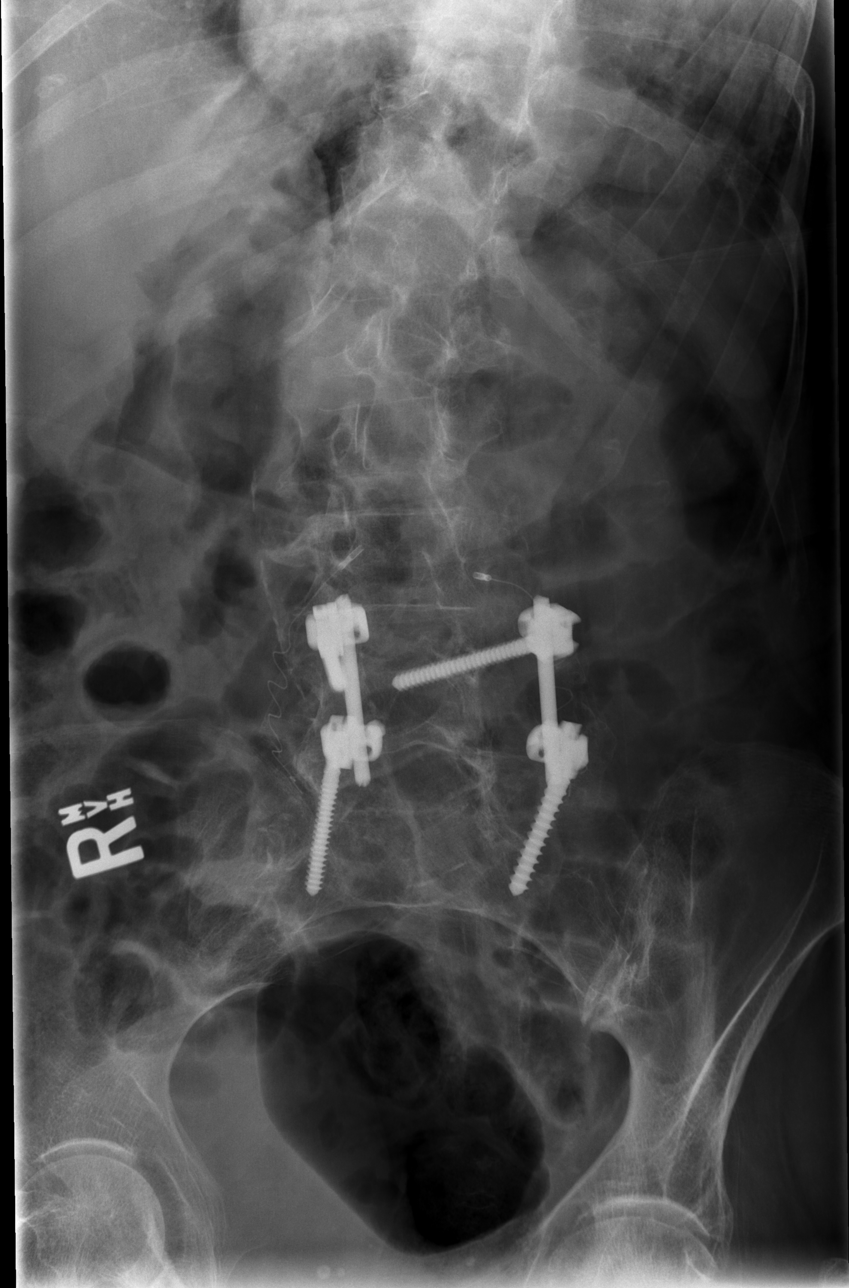

[w lumbar spine lat]
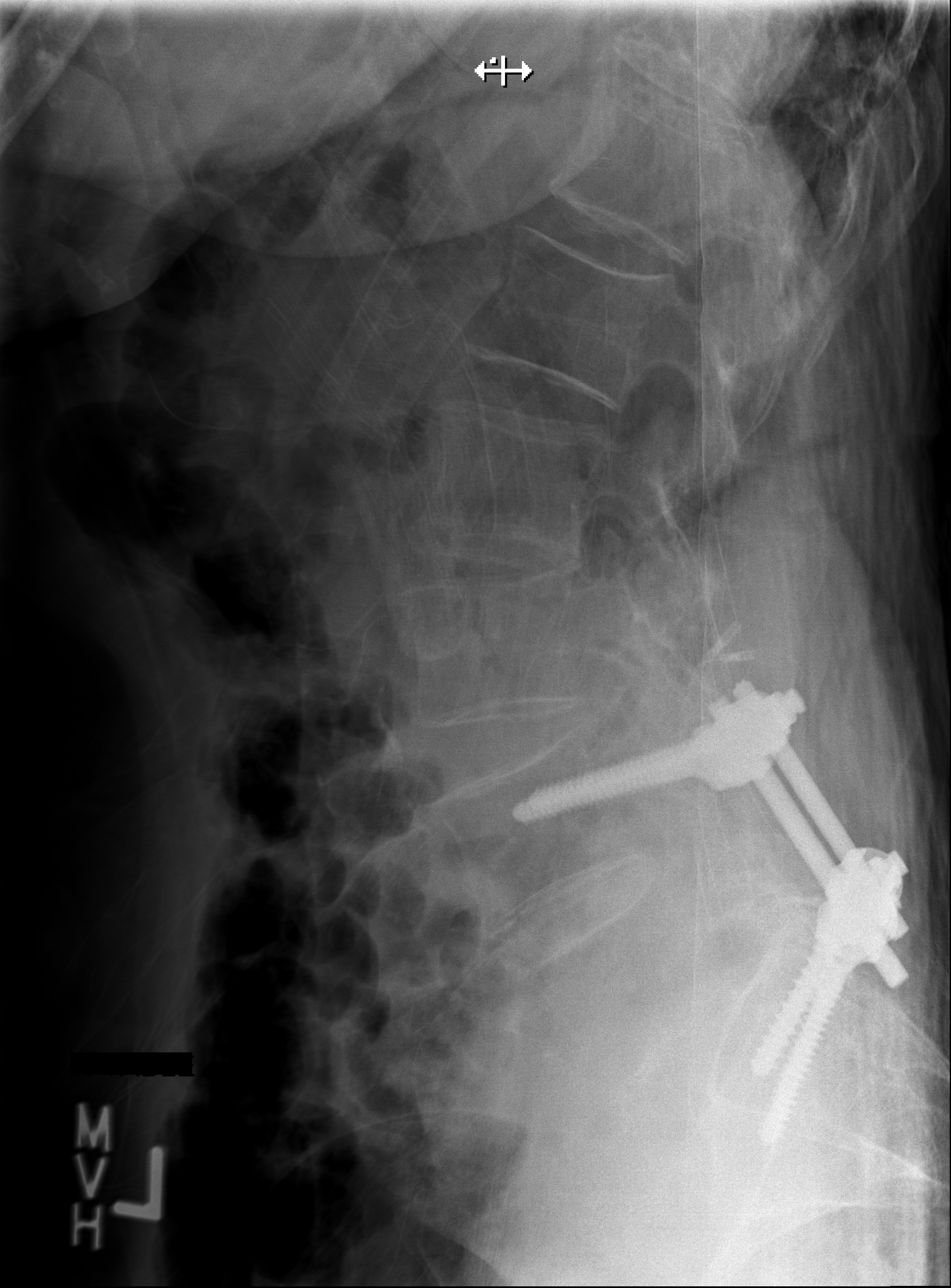

[w lumbar l-5 s-1 spot]
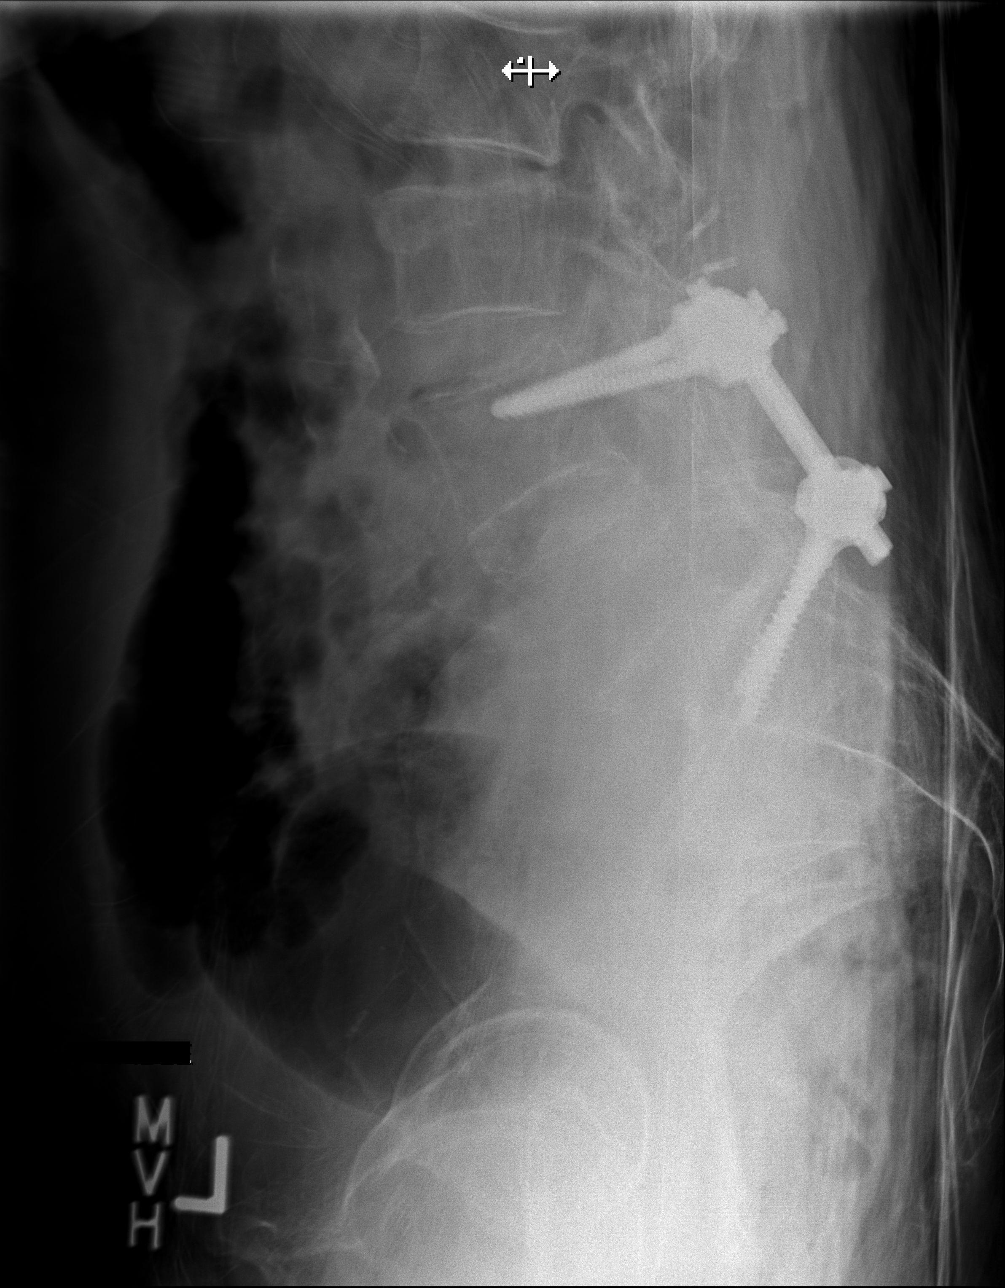

[5 of 5 positions shown; findings below may reference images not displayed]

FINDINGS: L4-S1 posterior spinal fusion with transpedicular screws at L4 and
S1. There is right convex lumbar scoliosis. No listhesis in the AP
direction. No abnormal lucency surrounding the hardware. No acute
fracture. The disc spaces are preserved. There is osteopenia of the
L5 vertebral body and its cortex is difficult to see well.
IMPRESSION: No acute fracture or static subluxation of the lumbar spine. Status
post L4-S1 posterior spinal fusion. Limited assessment of the L5
vertebral body due to the degree of osteopenia and overlying
structures.

## 2018-06-05 IMAGING — CR DG HIP (WITH OR WITHOUT PELVIS) 2-3V*L*
3 series · 3 of 3 positions shown · non-contrast
Comparison: 06/02/2016 pelvic and left hip radiographs.

CLINICAL DATA: Tripped or walking.  Left hip pain.

EXAM:
DG HIP (WITH OR WITHOUT PELVIS) 2-3V LEFT

[t pelvis ap]
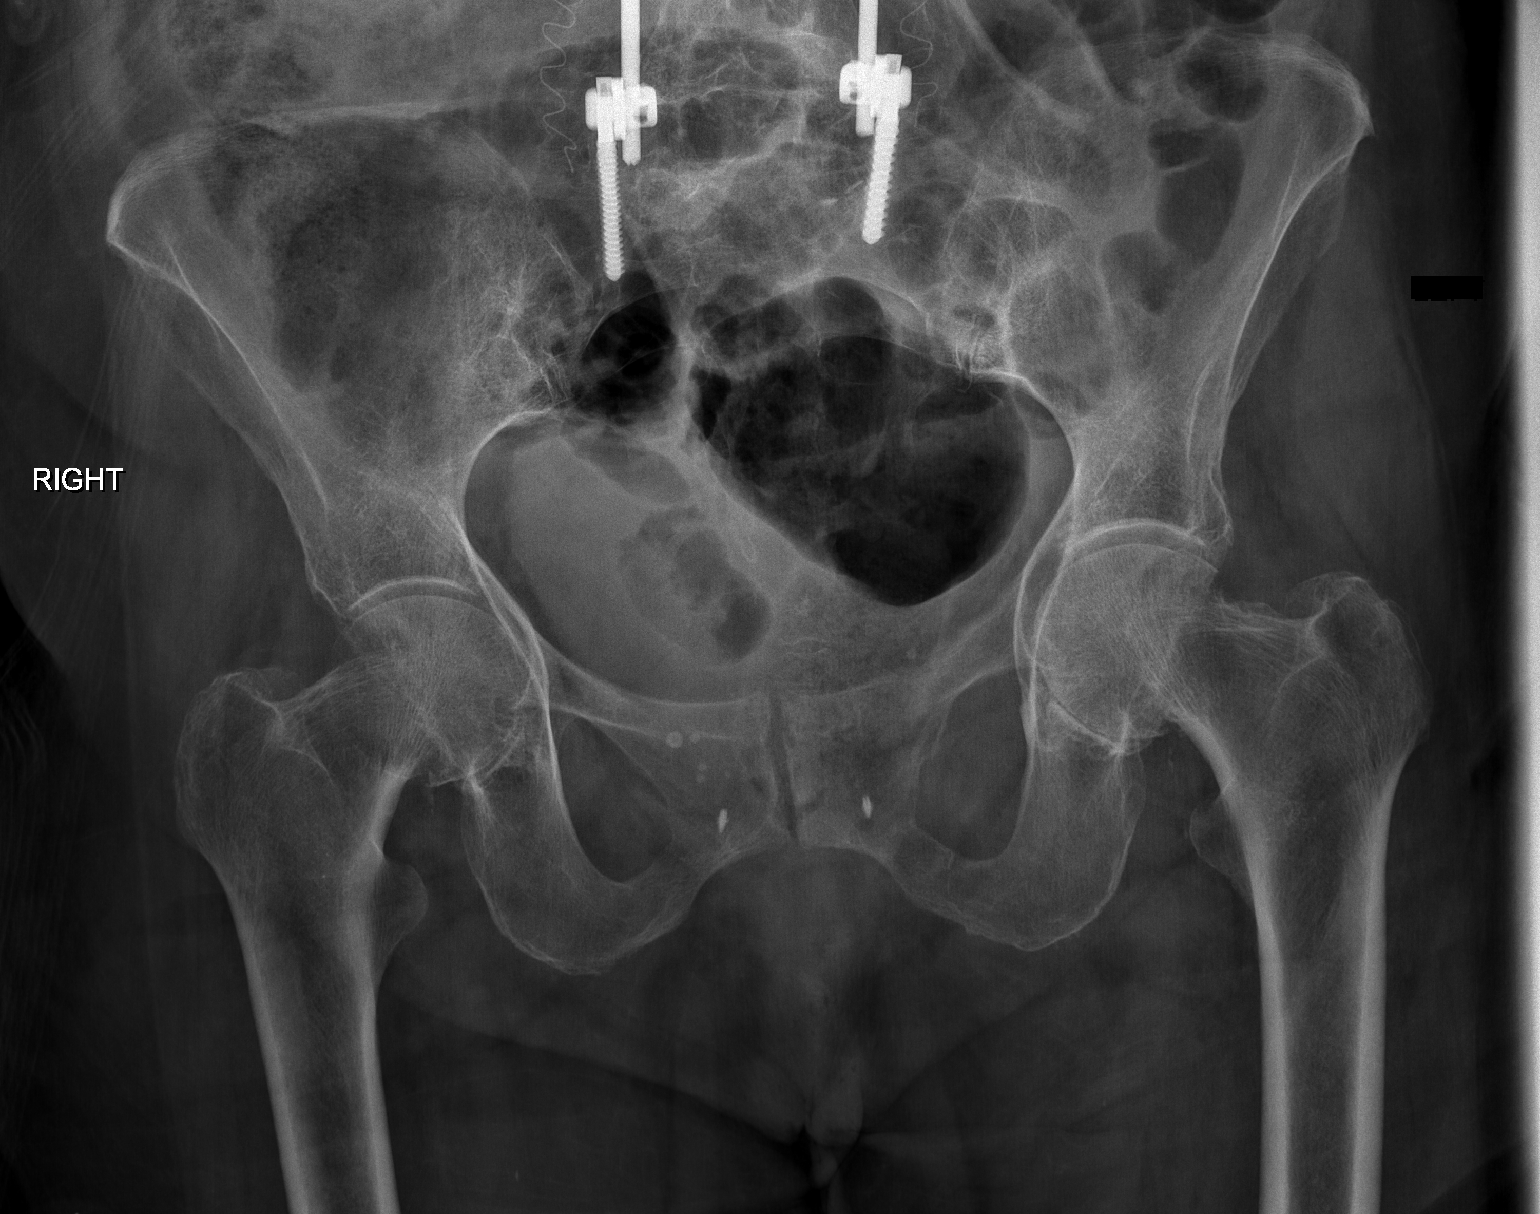

[t hip ap left]
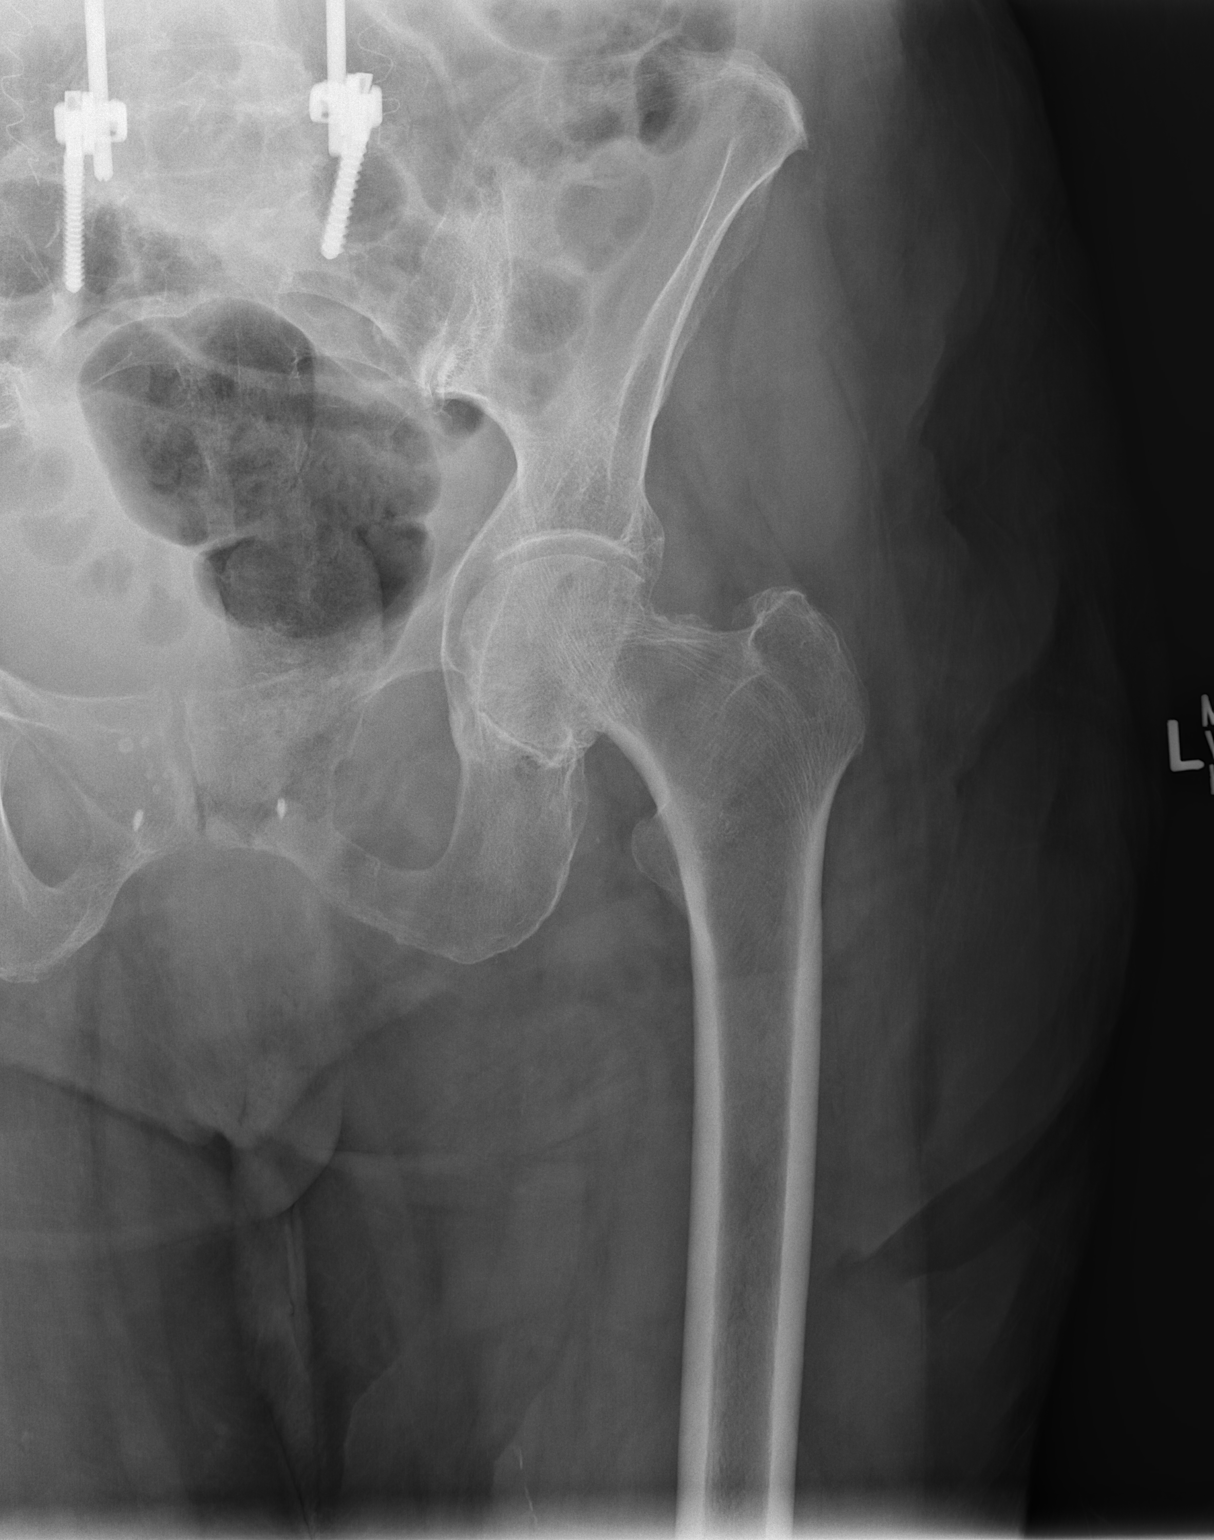

[t hip frog leg left]
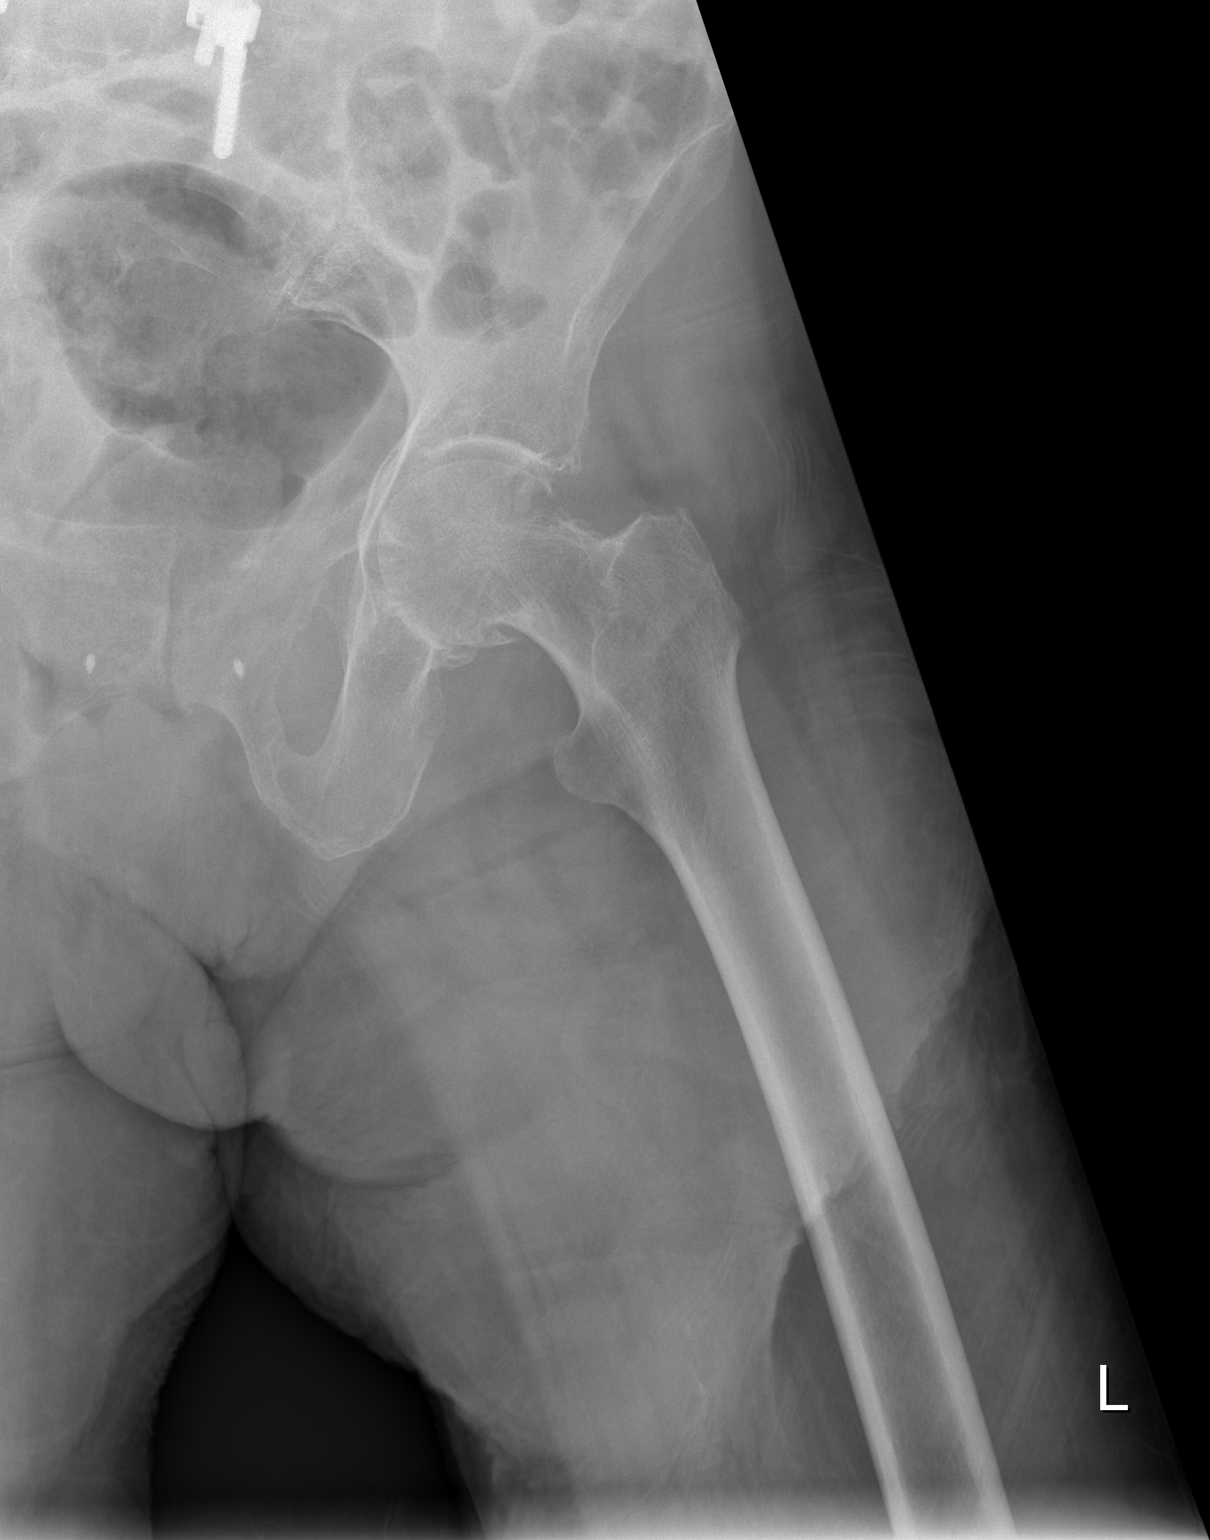

[3 of 3 positions shown; findings below may reference images not displayed]

FINDINGS: No pelvic fracture or diastasis. No definite left hip fracture. No
left hip dislocation. Stable chronic cortical irregularity at the
superior aspect of the left femoral neck near the femoral head. Mild
osteoarthritis in both hip joints. No suspicious focal osseous
lesions. Partially visualized surgical hardware overlying the
bilateral lumbosacral junction.
IMPRESSION: No fracture. No left hip dislocation. Mild bilateral hip
osteoarthritis.

Stable chronic cortical irregularity at the superior left femoral
neck near the femoral head, characterized as chronic degenerative
change on 06/02/2016 left hip CT study.

## 2018-07-07 DEATH — deceased

## 2023-05-07 ENCOUNTER — Encounter (HOSPITAL_COMMUNITY): Payer: Self-pay | Admitting: *Deleted
